# Patient Record
Sex: Female | Born: 1975 | Race: Black or African American | Hispanic: No | Marital: Single | Smoking: Never smoker
Health system: Southern US, Community
[De-identification: ages and names within clinical notes are randomized; demographics above are authoritative.]

## PROBLEM LIST (undated history)

## (undated) DIAGNOSIS — E785 Hyperlipidemia, unspecified: Secondary | ICD-10-CM

---

## 2011-08-02 HISTORY — PX: OTHER SURGICAL HISTORY: SHX169

## 2014-07-16 ENCOUNTER — Other Ambulatory Visit (HOSPITAL_COMMUNITY): Payer: Self-pay | Admitting: Physician Assistant

## 2014-07-16 ENCOUNTER — Other Ambulatory Visit: Payer: Self-pay | Admitting: Family Medicine

## 2014-07-16 DIAGNOSIS — Z1231 Encounter for screening mammogram for malignant neoplasm of breast: Secondary | ICD-10-CM

## 2014-08-04 ENCOUNTER — Ambulatory Visit (HOSPITAL_COMMUNITY)
Admission: RE | Admit: 2014-08-04 | Discharge: 2014-08-04 | Disposition: A | Discharge status: Home / Self Care | Payer: Self-pay | Source: Ambulatory Visit | Attending: Physician Assistant | Admitting: Physician Assistant

## 2014-08-04 DIAGNOSIS — Z1231 Encounter for screening mammogram for malignant neoplasm of breast: Secondary | ICD-10-CM

## 2016-01-15 ENCOUNTER — Ambulatory Visit (HOSPITAL_COMMUNITY)
Admission: EM | Admit: 2016-01-15 | Discharge: 2016-01-15 | Disposition: A | Discharge status: Home / Self Care | Payer: BLUE CROSS/BLUE SHIELD | Attending: Family Medicine | Admitting: Family Medicine

## 2016-01-15 ENCOUNTER — Encounter (HOSPITAL_COMMUNITY): Payer: Self-pay | Admitting: Nurse Practitioner

## 2016-01-15 DIAGNOSIS — T148 Other injury of unspecified body region: Secondary | ICD-10-CM

## 2016-01-15 DIAGNOSIS — W57XXXA Bitten or stung by nonvenomous insect and other nonvenomous arthropods, initial encounter: Secondary | ICD-10-CM | POA: Diagnosis not present

## 2016-01-15 MED ORDER — TRIAMCINOLONE ACETONIDE 40 MG/ML IJ SUSP
INTRAMUSCULAR | Status: AC
  Filled 2016-01-15: qty 1

## 2016-01-15 MED ORDER — METHYLPREDNISOLONE ACETATE 80 MG/ML IJ SUSP
INTRAMUSCULAR | Status: AC
  Filled 2016-01-15: qty 1

## 2016-01-15 MED ORDER — TRIAMCINOLONE ACETONIDE 40 MG/ML IJ SUSP
40.0000 mg | Freq: Once | INTRAMUSCULAR | Status: AC
  Administered 2016-01-15: 40 mg via INTRAMUSCULAR

## 2016-01-15 MED ORDER — FLUTICASONE PROPIONATE 0.05 % EX CREA: TOPICAL_CREAM | Freq: Two times a day (BID) | CUTANEOUS | Status: DC

## 2016-01-15 MED ORDER — METHYLPREDNISOLONE ACETATE 80 MG/ML IJ SUSP
80.0000 mg | Freq: Once | INTRAMUSCULAR | Status: AC
  Administered 2016-01-15: 80 mg via INTRAMUSCULAR

## 2016-01-15 NOTE — ED Provider Notes (Signed)
CSN: [id number]     Arrival date & time 01/15/16  1425 History   First MD Initiated Contact with Patient 01/15/16 1443     Chief Complaint  Patient presents with  . Rash   (Consider location/radiation/quality/duration/timing/severity/associated sxs/prior Treatment) Patient is a 40 y.o. female presenting with rash. The history is provided by the patient.  Rash Location:  Leg Leg rash location:  R lower leg, L lower leg, R upper leg and L upper leg Quality: itchiness and redness   Chronicity:  New Context: insect bite/sting   Context comment:  Onset after sand flea bites from cruise ship [place].   History reviewed. No pertinent past medical history. History reviewed. No pertinent past surgical history. History reviewed. No pertinent family history. Social History  Substance Use Topics  . Smoking status: Never Smoker   . Smokeless tobacco: None  . Alcohol Use: No   OB History    No data available     Review of Systems  Constitutional: Negative.   Skin: Positive for rash.  All other systems reviewed and are negative.   Allergies  Review of patient's allergies indicates no known allergies.  Home Medications   Prior to Admission medications   Medication Sig Start Date End Date Taking? Authorizing Provider  fluticasone (CUTIVATE) 0.05 % cream Apply topically 2 (two) times daily. 01/15/16   Linna HoffJames D Kindl, MD   Meds Ordered and Administered this Visit   Medications  methylPREDNISolone acetate (DEPO-MEDROL) injection 80 mg (not administered)  triamcinolone acetonide (KENALOG-40) injection 40 mg (not administered)    BP 138/80 mmHg  Pulse 77  Temp(Src) 98.6 F (37 C) (Oral)  Resp 18  SpO2 100% No data found.   Physical Exam  Constitutional: She is oriented to person, place, and time. She appears well-developed and well-nourished. No distress.  Neurological: She is alert and oriented to person, place, and time.  Skin: Skin is warm and dry. Rash noted.  There is erythema.  Mult excoriated papular lesions scattered on bilat lower legs.erythema but no infection.  Nursing note and vitals reviewed.   ED Course  Procedures (including critical care time)  Labs Review Labs Reviewed - No data to display  Imaging Review No results found.   Visual Acuity Review  Right Eye Distance:   Left Eye Distance:   Bilateral Distance:    Right Eye Near:   Left Eye Near:    Bilateral Near:         MDM   1. Multiple insect bites    Meds ordered this encounter  Medications  . methylPREDNISolone acetate (DEPO-MEDROL) injection 80 mg    Sig:   . triamcinolone acetonide (KENALOG-40) injection 40 mg    Sig:   . fluticasone (CUTIVATE) 0.05 % cream    Sig: Apply topically 2 (two) times daily.    Dispense:  30 g    Refill:  1       Linna HoffJames D Kindl, MD 01/15/16 [phone]

## 2016-01-15 NOTE — ED Notes (Signed)
She c/o 10 day history of insect bites to BLE. Onset after she was outside in [place] and [place]. She has tried hydrocortisone and benadryl with no relief.

## 2016-03-17 ENCOUNTER — Other Ambulatory Visit: Payer: Self-pay | Admitting: Physician Assistant

## 2016-03-17 DIAGNOSIS — Z1231 Encounter for screening mammogram for malignant neoplasm of breast: Secondary | ICD-10-CM

## 2016-03-23 ENCOUNTER — Ambulatory Visit
Admission: RE | Admit: 2016-03-23 | Discharge: 2016-03-23 | Disposition: A | Discharge status: Home / Self Care | Payer: BLUE CROSS/BLUE SHIELD | Source: Ambulatory Visit | Attending: Physician Assistant | Admitting: Physician Assistant

## 2016-03-23 DIAGNOSIS — Z1231 Encounter for screening mammogram for malignant neoplasm of breast: Secondary | ICD-10-CM

## 2016-10-23 ENCOUNTER — Encounter (HOSPITAL_COMMUNITY): Payer: Self-pay | Admitting: Emergency Medicine

## 2016-10-23 ENCOUNTER — Ambulatory Visit (HOSPITAL_COMMUNITY)
Admission: EM | Admit: 2016-10-23 | Discharge: 2016-10-23 | Disposition: A | Discharge status: Home / Self Care | Payer: BLUE CROSS/BLUE SHIELD | Attending: Family Medicine | Admitting: Family Medicine

## 2016-10-23 ENCOUNTER — Emergency Department (HOSPITAL_COMMUNITY)
Admission: EM | Admit: 2016-10-23 | Discharge: 2016-10-23 | Disposition: A | Discharge status: Home / Self Care | Payer: BLUE CROSS/BLUE SHIELD | Attending: Emergency Medicine | Admitting: Emergency Medicine

## 2016-10-23 DIAGNOSIS — R202 Paresthesia of skin: Secondary | ICD-10-CM | POA: Diagnosis not present

## 2016-10-23 DIAGNOSIS — M62838 Other muscle spasm: Secondary | ICD-10-CM | POA: Diagnosis not present

## 2016-10-23 DIAGNOSIS — M79601 Pain in right arm: Secondary | ICD-10-CM

## 2016-10-23 DIAGNOSIS — R200 Anesthesia of skin: Secondary | ICD-10-CM | POA: Diagnosis present

## 2016-10-23 MED ORDER — CYCLOBENZAPRINE HCL 10 MG PO TABS
10.0000 mg | ORAL_TABLET | Freq: Once | ORAL | Status: AC
  Administered 2016-10-23: 10 mg via ORAL
  Filled 2016-10-23: qty 1

## 2016-10-23 MED ORDER — CYCLOBENZAPRINE HCL 10 MG PO TABS: 10.0000 mg | ORAL_TABLET | Freq: Three times a day (TID) | ORAL | 0 refills | Status: DC | PRN

## 2016-10-23 MED ORDER — HYDROCODONE-ACETAMINOPHEN 5-325 MG PO TABS
1.0000 | ORAL_TABLET | Freq: Once | ORAL | Status: AC
  Administered 2016-10-23: 1 via ORAL
  Filled 2016-10-23: qty 1

## 2016-10-23 MED ORDER — HYDROCODONE-ACETAMINOPHEN 5-325 MG PO TABS: 1.0000 | ORAL_TABLET | Freq: Four times a day (QID) | ORAL | 0 refills | Status: AC | PRN

## 2016-10-23 MED ORDER — NAPROXEN 500 MG PO TABS: 500.0000 mg | ORAL_TABLET | Freq: Two times a day (BID) | ORAL | 0 refills | Status: AC | PRN

## 2016-10-23 NOTE — ED Triage Notes (Signed)
[facility] with a complaint of right arm numbness and tingling x 2 weeks that has recently caused a discoloration of the palm of her right hand.

## 2016-10-23 NOTE — ED Notes (Signed)
Pt is in stable condition upon d/c and ambulates from ED. 

## 2016-10-23 NOTE — ED Notes (Signed)
Right radial pulse 2 +.  Brisk capillary refill to all nailbeds.  Patient continues to reports arm feels different.  Patient has been rubbing both hands on jeans.  Able to remove blue coloring from right palm and fingers with alcohol swab

## 2016-10-23 NOTE — ED Provider Notes (Signed)
[facility]-EMERGENCY DEPT Provider Note   CSN: [id number] Arrival date & time: 10/23/16  1528     History   Chief Complaint Chief Complaint  Patient presents with  . Numbness    HPI Lori Foster is a 41 y.o. R-handed female with no significant PMHx, who presents to the ED sent from [facility] for evaluation of her R arm pain and R palm tingling. Per pt, 2 weeks ago she had gradual onset right shoulder and arm pain, initially she thought that she had slept on it wrong, but gradually over the course of the last 2 weeks the pain has become more intense and gradually moved into her right forearm. She has also developed some tingling in her right palm, diffusely throughout her palm with no specific or focal area; denies tingling on the dorsal aspect of her hand or elsewhere in her arm. She went to urgent care for this issue, however they sent her here for evaluation. Per chart review, [facility] notes reported that she was rubbing her hand on her jeans, and initially her palms looked blue, but alcohol wipes were used to clean her hands and some blue dye was removed from her palms.   She describes her pain as 7/10 constant soreness and throbbing in her right forearm, radiating down to her wrist and hand, worse with laying on her right arm, and on improved with ibuprofen and Aleve. She denies any repetitive movement activities with her wrist, denies playing tennis or golf, denies knitting or performing any other repetitive movements. She denies any injuries. She also denies any fevers, chills, neck pain, chest pain, shortness of breath, abdominal pain, n/v/d/c, dysuria, hematuria, numbness, focal weakness, incontinence of urine or stool, saddle anesthesia or cauda equina symptoms, bruising, swelling, rashes or itching, headache, vision changes, or any other complaints at this time.   The history is provided by the patient and medical records. No language interpreter was used.  Arm Injury   This is a new problem. The  current episode started more than 1 week ago. The problem occurs constantly. The problem has been gradually worsening. The pain is present in the right arm. Quality: throbbing soreness. The pain is at a severity of 7/10. The pain is moderate. Associated symptoms include tingling (R palm). Pertinent negatives include no numbness and full range of motion. Exacerbated by: lying on R arm. She has tried OTC pain medications for the symptoms. The treatment provided no relief. There has been no history of extremity trauma.    History reviewed. No pertinent past medical history.  There are no active problems to display for this patient.   Past Surgical History:  Procedure Laterality Date  . LEAP  2013    OB History    No data available       Home Medications    Prior to Admission medications   Medication Sig Start Date End Date Taking? Authorizing Provider  naproxen sodium (ANAPROX) 220 MG tablet Take 440 mg by mouth 2 (two) times daily with a meal.    Historical Provider, MD    Family History No family history on file.  Social History Social History  Substance Use Topics  . Smoking status: Never Smoker  . Smokeless tobacco: Never Used  . Alcohol use No     Allergies   Patient has no known allergies.   Review of Systems Review of Systems  Constitutional: Negative for chills and fever.  Eyes: Negative for visual disturbance.  Respiratory: Negative for shortness of breath.  Cardiovascular: Negative for chest pain.  Gastrointestinal: Negative for abdominal pain, constipation, diarrhea, nausea and vomiting.  Genitourinary: Negative for difficulty urinating (no incontinence), dysuria and hematuria.  Musculoskeletal: Positive for myalgias (R shoulder/forearm). Negative for arthralgias, back pain, joint swelling and neck pain.  Skin: Negative for color change and rash.  Allergic/Immunologic: Negative for immunocompromised state.  Neurological: Positive for tingling (R palm).  Negative for weakness, numbness and headaches.       +R palm tingling  Psychiatric/Behavioral: Negative for confusion.   10 Systems reviewed and are negative for acute change except as noted in the HPI.   Physical Exam Updated Vital Signs BP (!) 156/88   Pulse 75   Temp 97.5 F (36.4 C) (Oral)   Ht 5\' 6"  (1.676 m)   Wt 77.1 kg   LMP 09/07/2016   SpO2 100%   BMI 27.44 kg/m   Physical Exam  Constitutional: She is oriented to person, place, and time. Vital signs are normal. She appears well-developed and well-nourished.  Non-toxic appearance. No distress.  Afebrile, nontoxic, NAD  HENT:  Head: Normocephalic and atraumatic.  Mouth/Throat: Oropharynx is clear and moist and mucous membranes are normal.  Eyes: Conjunctivae and EOM are normal. Right eye exhibits no discharge. Left eye exhibits no discharge.  Neck: Normal range of motion. Neck supple. Muscular tenderness present. No spinous process tenderness present. No neck rigidity. Normal range of motion present.    FROM intact without spinous process TTP, no bony stepoffs or deformities, with mild R sided trapezius and paraspinous muscle TTP and palpable muscle spasms. No rigidity or meningeal signs. No bruising or swelling.   Cardiovascular: Normal rate, regular rhythm, normal heart sounds and intact distal pulses.  Exam reveals no gallop and no friction rub.   No murmur heard. Pulmonary/Chest: Effort normal and breath sounds normal. No respiratory distress. She has no decreased breath sounds. She has no wheezes. She has no rhonchi. She has no rales.  Abdominal: Soft. Normal appearance and bowel sounds are normal. She exhibits no distension. There is no tenderness. There is no rigidity, no rebound, no guarding, no CVA tenderness, no tenderness at McBurney's point and negative Murphy's sign.  Musculoskeletal: Normal range of motion.       Right shoulder: She exhibits tenderness (trapezius) and spasm. She exhibits normal range of  motion, no bony tenderness, no swelling, no effusion, no crepitus, no deformity, normal pulse and normal strength.       Right forearm: She exhibits tenderness. She exhibits no bony tenderness, no swelling, no edema and no deformity.       Right hand: She exhibits normal range of motion, no tenderness, no bony tenderness, normal capillary refill, no deformity and no swelling. Normal sensation noted. Normal strength noted.  C-spine as above, all other spinal levels nonTTP without bony stepoffs or deformities R shoulder with FROM intact, no bony TTP, with mild trapezius muscular TTP and spasms, and R forearm with diffuse extensor musculature tenderness and mild tightness/spasm felt, no focal bony or joint line TTP to wrist, hand, or elbow. No swelling/effusion to arm or any joints in RUE, no bruising or erythema, no abnormal warmth, no crepitus/deformity.  B/l palms slightly bluish tinted from jeans, however dorsal aspect of hands without abnormal discoloration. Hands and fingers warm to touch. No poikilothermia. B/l arm strength 5/5 in all areas (deltoids, triceps/biceps, wrist flexion/extension, hand opposition/abduction, etc).  Sensation grossly intact in all extremities, including to R hand. Distal pulses intact.  Cap refill brisk and present  in all digits. Compartments of all extremities are soft.  No extremity swelling or edema  Neurological: She is alert and oriented to person, place, and time. She has normal strength. No sensory deficit.  Skin: Skin is warm, dry and intact. No rash noted.  B/l palms slightly bluish due to rubbing hands on her dark blue jeans, which is removed with rubbing alcohol; remainder of skin appropriate for race. No paleness. Hands warm and dry.  Psychiatric: She has a normal mood and affect.  Nursing note and vitals reviewed.    ED Treatments / Results  Labs (all labs ordered are listed, but only abnormal results are displayed) Labs Reviewed - No data to  display  EKG  EKG Interpretation None       Radiology No results found.  Procedures Procedures (including critical care time)  Medications Ordered in ED Medications  cyclobenzaprine (FLEXERIL) tablet 10 mg (10 mg Oral Given 10/23/16 1639)  HYDROcodone-acetaminophen (NORCO/VICODIN) 5-325 MG per tablet 1 tablet (1 tablet Oral Given 10/23/16 1639)     Initial Impression / Assessment and Plan / ED Course  I have reviewed the triage vital signs and the nursing notes.  Pertinent labs & imaging results that were available during my care of the patient were reviewed by me and considered in my medical decision making (see chart for details).     41 y.o. female here from [facility] for further eval of her R arm pain and paresthesias x2 wks. They were initially concerned that her hands were blue, but later found that it was due to rubbing her jeans and the dye rubbed off onto her palms; able to mostly get it off with alcohol. On exam, no focal spinal tenderness, tenderness to R trapezius region with +spasm, and diffusely in the R forearm musculature, no focal bony or joint line TTP, sensation intact, distal pulses intact, cap refill brisk and present in all digits, hands and fingers feel warm and perfused, strength intact and symmetric. Overall it seems that she likely has perhaps a pinched nerve either related to the muscle spasm in her trapezius or perhaps from a cervical etiology, however denies neck pain and no focal tenderness in C-spine; no s/sx of cord compression; doubt need for labs or imaging. Doubt neurologic etiology. Doubt DVT, acute arterial occlusion, or other vascular etiology. Will give flexeril and norco, will hold naprosyn since she just took it PTA; will reassess shortly.   6:23 PM Pt feeling better, symptoms improving. Likely muscle spasm causing slight nerve impingement. Of note, washed hands and her hands are no longer bluish tinted. Will send home with rx for naprosyn, norco, and  flexeril. [place] prior to dispensing controlled substance medications, and was notable for: no controlled substances found on 1 year search. Risks/benefits/alternatives and expectations discussed regarding controlled substances. Side effects of medications discussed. Informed consent obtained.  Advised ice/heat use. F/up with PCP in 1wk for recheck of symptoms. I explained the diagnosis and have given explicit precautions to return to the ER including for any other new or worsening symptoms. The patient understands and accepts the medical plan as it's been dictated and I have answered their questions. Discharge instructions concerning home care and prescriptions have been given. The patient is STABLE and is discharged to home in good condition.   Final Clinical Impressions(s) / ED Diagnoses   Final diagnoses:  Trapezius muscle spasm  Right arm pain  Paresthesias    New Prescriptions New Prescriptions   CYCLOBENZAPRINE (FLEXERIL) 10  MG TABLET    Take 1 tablet (10 mg total) by mouth 3 (three) times daily as needed for muscle spasms.   HYDROCODONE-ACETAMINOPHEN (NORCO) 5-325 MG TABLET    Take 1 tablet by mouth every 6 (six) hours as needed for severe pain.   NAPROXEN (NAPROSYN) 500 MG TABLET    Take 1 tablet (500 mg total) by mouth 2 (two) times daily as needed for mild pain, moderate pain or headache (TAKE WITH MEALS.).     [address] Big Rock [address]ove AvenueMercedes Lexington Devine, PA-C 10/23/16 1824    Lorre NickAnthony Allen, MD 10/25/16 1210

## 2016-10-23 NOTE — ED Triage Notes (Addendum)
Pt sent from urgent care for further eval of numbness and tingling to right arm x 2 weeks. Pt denies injury. Pt has discoloration to B/L hands R>L. B/L hands cool to touch R>L

## 2016-10-23 NOTE — ED Provider Notes (Signed)
CSN: [id number]     Arrival date & time 10/23/16  1351 History   First MD Initiated Contact with Patient 10/23/16 1456     Chief Complaint  Patient presents with  . Tingling   (Consider location/radiation/quality/duration/timing/severity/associated sxs/prior Treatment)  HPI   She is a 41 year old female presenting today with complaints of right shoulder and arm pain for approximately 2 weeks. Patient states the pain is severe in nature and has progressed to tingling and numbness where she has lost sensation to her right hand. Patient denies any known injury, blood clots or bleeding disorder and has never been a smoker. She is not taking oral birth control  History reviewed. No pertinent past medical history. Past Surgical History:  Procedure Laterality Date  . LEAP  2013   History reviewed. No pertinent family history. Social History  Substance Use Topics  . Smoking status: Never Smoker  . Smokeless tobacco: Never Used  . Alcohol use No   OB History    No data available     Review of Systems  Constitutional: Negative.  Negative for fatigue and fever.  HENT: Negative.   Eyes: Negative.  Negative for visual disturbance.  Respiratory: Negative.  Negative for cough and shortness of breath.   Cardiovascular: Negative.  Negative for chest pain and leg swelling.  Gastrointestinal: Negative.   Endocrine: Negative.   Genitourinary: Negative.   Musculoskeletal: Negative.  Negative for gait problem and neck stiffness.  Skin: Positive for color change.  Allergic/Immunologic: Negative.   Neurological: Positive for numbness. Negative for dizziness and headaches.  Hematological: Negative.   Psychiatric/Behavioral: Negative.     Allergies  Patient has no known allergies.  Home Medications   Prior to Admission medications   Medication Sig Start Date End Date Taking? Authorizing Provider  naproxen sodium (ANAPROX) 220 MG tablet Take 440 mg by mouth 2 (two) times daily with a meal.    Yes Historical Provider, MD   Meds Ordered and Administered this Visit  Medications - No data to display  BP (!) 145/101 (BP Location: Left Arm)   Pulse 81   Temp 98.5 F (36.9 C) (Oral)   Resp 18   SpO2 100%  No data found.  Manual B/P checked on L arm.  154/82.  Physical Exam  Constitutional: She is oriented to person, place, and time. She appears well-developed and well-nourished. No distress.  Cardiovascular: Normal rate, regular rhythm, normal heart sounds and intact distal pulses.  Exam reveals no gallop and no friction rub.   No murmur heard. Pulmonary/Chest: Effort normal and breath sounds normal. No respiratory distress. She has no wheezes. She has no rales. She exhibits no tenderness.  Musculoskeletal: Normal range of motion. She exhibits tenderness. She exhibits no edema.  R lower extremity is painful to patient.   Neurological: She is alert and oriented to person, place, and time. She exhibits normal muscle tone. Coordination normal.  Skin: She is not diaphoretic.  Patient has 2+ bilateral radial pulses, but right hand is cooler than left and discolored compared to left.  Initially R hand appeared significantly blue.  Patient was rubbing R hand on her jeans from pain and we were able to remove blue dye from R hand with alcohol preps.    Nursing note and vitals reviewed.   Urgent Care Course     Procedures (including critical care time)  Labs Review Labs Reviewed - No data to display  Imaging Review No results found.  Plan of care was discussed with  Dr. Milus Glazier and patient sent to [facility] ED for further workup and evaluation.   MDM   1. Right arm pain    The usual and customary discharge instructions and warnings were given.  The patient verbalizes understanding and agrees to plan of care.       Servando Salina, NP 10/23/16 1528

## 2016-10-23 NOTE — Discharge Instructions (Signed)
Take naprosyn as directed for inflammation and pain with norco for breakthrough pain and flexeril for muscle relaxation. Do not drive or operate machinery with pain medication or muscle relaxant use. Use heat or ice to areas of soreness, using ice/heat pack for no more than 20 minutes at a time every hour for each. Follow up with primary care physician for recheck of ongoing symptoms in the next 1-2 weeks. Return to ER for emergent changing or worsening of symptoms.

## 2016-11-04 ENCOUNTER — Encounter: Payer: Self-pay | Admitting: Neurology

## 2016-11-07 ENCOUNTER — Other Ambulatory Visit: Payer: Self-pay | Admitting: *Deleted

## 2016-11-07 DIAGNOSIS — M25511 Pain in right shoulder: Secondary | ICD-10-CM

## 2016-11-22 ENCOUNTER — Ambulatory Visit (INDEPENDENT_AMBULATORY_CARE_PROVIDER_SITE_OTHER): Payer: BLUE CROSS/BLUE SHIELD | Admitting: Neurology

## 2016-11-22 DIAGNOSIS — M25511 Pain in right shoulder: Secondary | ICD-10-CM | POA: Diagnosis not present

## 2016-11-22 NOTE — Procedures (Signed)
[facility]  [address] [place], [address]  [place], [place] [address] Tel: [phone] Fax:  [phone] Test Date:  11/22/2016  Patient: Lori Foster DOB: 02/06/1976 Physician: Nita Sickle, DO  Sex: Female Height:  Ref Phys: Gardenia Phlegm  ID#: [id number] Temp: 32.5C Technician:    Patient Complaints: This is a 41 year-old female referred for evaluation of one-month history of right shoulder pain radiating into her forearm and hand.  NCV & EMG Findings: Extensive electrodiagnostic testing of the right upper extremitiy shows: 1. All sensory responses are within normal limits include right median, ulnar, radial, mixed palmer, medial and lateral antebrachial nerves. 2. Right median and ulnar motor responses are within normal limits. 3. There is no evidence of nerve or muscle injury affecting any of the tested muscles.  Motor unit configuration and recruitment pattern is within normal limits.  Impression: This is a normal study of the right upper extremity.   In particular, there is no evidence of a cervical radiculopathy, brachial plexopathy, or carpal tunnel syndrome.   ___________________________ Nita Sickle, DO    Nerve Conduction Studies Anti Sensory Summary Table   Site NR Peak (ms) Norm Peak (ms) P-T Amp (V) Norm P-T Amp  Right Lat Ante Brach Cutan Anti Sensory (Lat Forearm)  32.5C  Lat Biceps    2.4 <2.9 12.0   Right Med Ante Brach Cutan Anti Sensory (Med Forearm)  32.5C  Elbow    2.4  11.8   Right Median Anti Sensory (2nd Digit)  32.5C  Wrist    3.2 <3.4 79.6 >20  Right Radial Anti Sensory (Base 1st Digit)  32.5C  Wrist    2.2 <2.7 34.4 >18  Right Ulnar Anti Sensory (5th Digit)  32.5C  Wrist    2.8 <3.1 65.2 >12   Motor Summary Table   Site NR Onset (ms) Norm Onset (ms) O-P Amp (mV) Norm O-P Amp Site1 Site2 Delta-0 (ms) Dist (cm) Vel (m/s) Norm Vel (m/s)  Right Median Motor (Abd Poll Brev)  32.5C  Wrist    2.5 <3.9 11.4 >6 Elbow  Wrist 4.3 27.0 63 >50  Elbow    6.8  11.3         Right Ulnar Motor (Abd Dig Minimi)  32.5C  Wrist    2.3 <3.1 10.7 >7 B Elbow Wrist 3.7 24.5 66 >50  B Elbow    6.0  10.0  A Elbow B Elbow 1.7 10.0 59 >50  A Elbow    7.7  9.7          Comparison Summary Table   Site NR Peak (ms) Norm Peak (ms) P-T Amp (V) Site1 Site2 Delta-P (ms) Norm Delta (ms)  Right Median/Ulnar Palm Comparison (Wrist - 8cm)  32.5C  Median Palm    1.8 <2.2 63.7 Median Palm Ulnar Palm 0.2   Ulnar Palm    1.6 <2.2 23.4       EMG   Side Muscle Ins Act Fibs Psw Fasc Number Recrt Dur Dur. Amp Amp. Poly Poly. Comment  Right 1stDorInt Nml Nml Nml Nml Nml Nml Nml Nml Nml Nml Nml Nml N/A  Right Ext Indicis Nml Nml Nml Nml Nml Nml Nml Nml Nml Nml Nml Nml N/A  Right PronatorTeres Nml Nml Nml Nml Nml Nml Nml Nml Nml Nml Nml Nml N/A  Right Biceps Nml Nml Nml Nml Nml Nml Nml Nml Nml Nml Nml Nml N/A  Right Triceps Nml Nml Nml Nml Nml Nml Nml Nml Nml Nml  Nml Nml N/A  Right Deltoid Nml Nml Nml Nml Nml Nml Nml Nml Nml Nml Nml Nml N/A  Right Infraspinatus Nml Nml Nml Nml Nml Nml Nml Nml Nml Nml Nml Nml N/A      Waveforms:

## 2017-05-09 ENCOUNTER — Other Ambulatory Visit: Payer: Self-pay | Admitting: Physician Assistant

## 2017-05-09 DIAGNOSIS — Z1231 Encounter for screening mammogram for malignant neoplasm of breast: Secondary | ICD-10-CM

## 2017-05-31 ENCOUNTER — Ambulatory Visit
Admission: RE | Admit: 2017-05-31 | Discharge: 2017-05-31 | Disposition: A | Discharge status: Home / Self Care | Payer: BLUE CROSS/BLUE SHIELD | Source: Ambulatory Visit | Attending: Physician Assistant | Admitting: Physician Assistant

## 2017-05-31 DIAGNOSIS — Z1231 Encounter for screening mammogram for malignant neoplasm of breast: Secondary | ICD-10-CM

## 2018-05-09 ENCOUNTER — Other Ambulatory Visit: Payer: Self-pay | Admitting: Physician Assistant

## 2018-05-09 DIAGNOSIS — Z1231 Encounter for screening mammogram for malignant neoplasm of breast: Secondary | ICD-10-CM

## 2018-06-11 ENCOUNTER — Ambulatory Visit
Admission: RE | Admit: 2018-06-11 | Discharge: 2018-06-11 | Disposition: A | Discharge status: Home / Self Care | Payer: BLUE CROSS/BLUE SHIELD | Source: Ambulatory Visit | Attending: Physician Assistant | Admitting: Physician Assistant

## 2018-06-11 DIAGNOSIS — Z1231 Encounter for screening mammogram for malignant neoplasm of breast: Secondary | ICD-10-CM

## 2018-06-13 ENCOUNTER — Other Ambulatory Visit: Payer: Self-pay | Admitting: Physician Assistant

## 2018-06-13 DIAGNOSIS — R928 Other abnormal and inconclusive findings on diagnostic imaging of breast: Secondary | ICD-10-CM

## 2018-06-19 ENCOUNTER — Ambulatory Visit
Admission: RE | Admit: 2018-06-19 | Discharge: 2018-06-19 | Disposition: A | Discharge status: Home / Self Care | Payer: BLUE CROSS/BLUE SHIELD | Source: Ambulatory Visit | Attending: Physician Assistant | Admitting: Physician Assistant

## 2018-06-19 DIAGNOSIS — R928 Other abnormal and inconclusive findings on diagnostic imaging of breast: Secondary | ICD-10-CM

## 2019-05-22 ENCOUNTER — Other Ambulatory Visit: Payer: Self-pay | Admitting: Physician Assistant

## 2019-05-22 DIAGNOSIS — Z1231 Encounter for screening mammogram for malignant neoplasm of breast: Secondary | ICD-10-CM

## 2019-07-09 ENCOUNTER — Other Ambulatory Visit: Payer: Self-pay

## 2019-07-09 ENCOUNTER — Ambulatory Visit
Admission: RE | Admit: 2019-07-09 | Discharge: 2019-07-09 | Disposition: A | Discharge status: Home / Self Care | Payer: BC Managed Care – PPO | Source: Ambulatory Visit | Attending: Physician Assistant | Admitting: Physician Assistant

## 2019-07-09 DIAGNOSIS — Z1231 Encounter for screening mammogram for malignant neoplasm of breast: Secondary | ICD-10-CM

## 2020-03-17 ENCOUNTER — Other Ambulatory Visit (HOSPITAL_COMMUNITY)
Admission: RE | Admit: 2020-03-17 | Discharge: 2020-03-17 | Disposition: A | Discharge status: Home / Self Care | Payer: BC Managed Care – PPO | Source: Ambulatory Visit | Attending: Physician Assistant | Admitting: Physician Assistant

## 2020-03-17 ENCOUNTER — Other Ambulatory Visit: Payer: Self-pay | Admitting: Physician Assistant

## 2020-03-17 DIAGNOSIS — Z124 Encounter for screening for malignant neoplasm of cervix: Secondary | ICD-10-CM | POA: Insufficient documentation

## 2020-03-20 LAB — CYTOLOGY - PAP
Adequacy: ABSENT
Diagnosis: NEGATIVE

## 2020-09-24 ENCOUNTER — Other Ambulatory Visit: Payer: Self-pay | Admitting: Physician Assistant

## 2020-09-24 DIAGNOSIS — Z1231 Encounter for screening mammogram for malignant neoplasm of breast: Secondary | ICD-10-CM

## 2020-11-13 ENCOUNTER — Inpatient Hospital Stay: Admission: RE | Admit: 2020-11-13 | Payer: BC Managed Care – PPO | Source: Ambulatory Visit

## 2020-11-16 ENCOUNTER — Other Ambulatory Visit: Payer: Self-pay

## 2020-11-16 ENCOUNTER — Ambulatory Visit
Admission: RE | Admit: 2020-11-16 | Discharge: 2020-11-16 | Disposition: A | Discharge status: Home / Self Care | Payer: BC Managed Care – PPO | Source: Ambulatory Visit | Attending: Physician Assistant | Admitting: Physician Assistant

## 2020-11-16 DIAGNOSIS — Z1231 Encounter for screening mammogram for malignant neoplasm of breast: Secondary | ICD-10-CM

## 2020-11-17 ENCOUNTER — Other Ambulatory Visit: Payer: Self-pay | Admitting: Physician Assistant

## 2020-11-17 DIAGNOSIS — R928 Other abnormal and inconclusive findings on diagnostic imaging of breast: Secondary | ICD-10-CM

## 2020-12-08 ENCOUNTER — Ambulatory Visit
Admission: RE | Admit: 2020-12-08 | Discharge: 2020-12-08 | Disposition: A | Discharge status: Home / Self Care | Payer: BC Managed Care – PPO | Source: Ambulatory Visit | Attending: Physician Assistant | Admitting: Physician Assistant

## 2020-12-08 ENCOUNTER — Other Ambulatory Visit: Payer: Self-pay | Admitting: Physician Assistant

## 2020-12-08 ENCOUNTER — Other Ambulatory Visit: Payer: Self-pay

## 2020-12-08 DIAGNOSIS — R928 Other abnormal and inconclusive findings on diagnostic imaging of breast: Secondary | ICD-10-CM

## 2021-06-11 ENCOUNTER — Ambulatory Visit
Admission: RE | Admit: 2021-06-11 | Discharge: 2021-06-11 | Disposition: A | Discharge status: Home / Self Care | Payer: BC Managed Care – PPO | Source: Ambulatory Visit | Attending: Physician Assistant | Admitting: Physician Assistant

## 2021-06-11 ENCOUNTER — Other Ambulatory Visit: Payer: Self-pay | Admitting: Physician Assistant

## 2021-06-11 ENCOUNTER — Other Ambulatory Visit: Payer: Self-pay

## 2021-06-11 DIAGNOSIS — N6320 Unspecified lump in the left breast, unspecified quadrant: Secondary | ICD-10-CM

## 2021-06-11 DIAGNOSIS — R928 Other abnormal and inconclusive findings on diagnostic imaging of breast: Secondary | ICD-10-CM

## 2021-10-17 ENCOUNTER — Emergency Department (HOSPITAL_BASED_OUTPATIENT_CLINIC_OR_DEPARTMENT_OTHER)
Admission: EM | Admit: 2021-10-17 | Discharge: 2021-10-17 | Disposition: A | Discharge status: Home / Self Care | Payer: BC Managed Care – PPO | Attending: Emergency Medicine | Admitting: Emergency Medicine

## 2021-10-17 ENCOUNTER — Emergency Department (HOSPITAL_BASED_OUTPATIENT_CLINIC_OR_DEPARTMENT_OTHER): Payer: BC Managed Care – PPO | Admitting: Radiology

## 2021-10-17 ENCOUNTER — Other Ambulatory Visit: Payer: Self-pay

## 2021-10-17 ENCOUNTER — Encounter (HOSPITAL_BASED_OUTPATIENT_CLINIC_OR_DEPARTMENT_OTHER): Payer: Self-pay | Admitting: Emergency Medicine

## 2021-10-17 DIAGNOSIS — R0789 Other chest pain: Secondary | ICD-10-CM | POA: Insufficient documentation

## 2021-10-17 DIAGNOSIS — R079 Chest pain, unspecified: Secondary | ICD-10-CM | POA: Diagnosis not present

## 2021-10-17 DIAGNOSIS — R0602 Shortness of breath: Secondary | ICD-10-CM | POA: Diagnosis not present

## 2021-10-17 HISTORY — DX: Hyperlipidemia, unspecified: E78.5

## 2021-10-17 LAB — BASIC METABOLIC PANEL
Anion gap: 13 (ref 5–15)
BUN: 16 mg/dL (ref 6–20)
CO2: 21 mmol/L — ABNORMAL LOW (ref 22–32)
Calcium: 9.8 mg/dL (ref 8.9–10.3)
Chloride: 103 mmol/L (ref 98–111)
Creatinine, Ser: 0.83 mg/dL (ref 0.44–1.00)
GFR, Estimated: >60 mL/min (ref >60)
Glucose, Bld: 98 mg/dL (ref 70–99)
Potassium: 3.8 mmol/L (ref 3.5–5.1)
Sodium: 137 mmol/L (ref 135–145)

## 2021-10-17 LAB — CBC
HCT: 40.2 % (ref 36.0–46.0)
Hemoglobin: 13.2 g/dL (ref 12.0–15.0)
MCH: 29.4 pg (ref 26.0–34.0)
MCHC: 32.8 g/dL (ref 30.0–36.0)
MCV: 89.5 fL (ref 80.0–100.0)
Platelets: 191 10*3/uL (ref 150–400)
RBC: 4.49 MIL/uL (ref 3.87–5.11)
RDW: 13.7 % (ref 11.5–15.5)
WBC: 9.6 10*3/uL (ref 4.0–10.5)
nRBC: 0 % (ref 0.0–0.2)

## 2021-10-17 LAB — TROPONIN I (HIGH SENSITIVITY)
Troponin I (High Sensitivity): <2 ng/L (ref <18)
Troponin I (High Sensitivity): <2 ng/L (ref <18)

## 2021-10-17 LAB — D-DIMER, QUANTITATIVE: D-Dimer, Quant: 0.47 ug/mL-FEU (ref 0.00–0.50)

## 2021-10-17 MED ORDER — MORPHINE SULFATE (PF) 4 MG/ML IV SOLN
4.0000 mg | Freq: Once | INTRAVENOUS | Status: AC
  Administered 2021-10-17: 4 mg via INTRAVENOUS
  Filled 2021-10-17: qty 1

## 2021-10-17 MED ORDER — DIPHENHYDRAMINE HCL 50 MG/ML IJ SOLN
50.0000 mg | Freq: Once | INTRAMUSCULAR | Status: AC
  Administered 2021-10-17: 50 mg via INTRAVENOUS
  Filled 2021-10-17: qty 1

## 2021-10-17 MED ORDER — NAPROXEN SODIUM 220 MG PO TABS: 440.0000 mg | ORAL_TABLET | Freq: Two times a day (BID) | ORAL | 0 refills | Status: AC | PRN

## 2021-10-17 MED ORDER — CYCLOBENZAPRINE HCL 10 MG PO TABS: 10.0000 mg | ORAL_TABLET | Freq: Three times a day (TID) | ORAL | 0 refills | Status: AC | PRN

## 2021-10-17 NOTE — ED Provider Notes (Signed)
?[facility]PT ?Provider Note ? ? ?CSN: [id number] ?Arrival date & time: 10/17/21  1409 ? ?  ? ?History ? ?Chief Complaint  ?Patient presents with  ? Chest Pain  ? ? ?Lori Foster is a 46 y.o. female. ? ?The history is provided by the patient and the spouse. No language interpreter was used.  ?Chest Pain ? ?46 year old female significant history of hyperlipidemia presenting complaining of chest pain patient reported cute onset of pain to the left side of her chest radiates to her back that started approximately an hour ago.  States she was just leaning down to pick up a pair of shoes when this pain came about.  Pain initially very intense and has improved a slight bit however worsening pain when she tries to take a deep breath.  No associated fever or chills no lightheadedness, dizziness, diaphoresis, nausea, vomiting, diarrhea, constipation, or abdominal pain.  No prior history of PE or DVT no recent surgery prolonged bedrest active cancer leg swelling calf pain or taking oral hormone.  Denies alcohol or tobacco use.  Does not take any medication on a regular basis. ? ?Home Medications ?Prior to Admission medications   ?Medication Sig Start Date End Date Taking? Authorizing Provider  ?cyclobenzaprine (FLEXERIL) 10 MG tablet Take 1 tablet (10 mg total) by mouth 3 (three) times daily as needed for muscle spasms. 10/23/16   Street, [place], PA-C  ?HYDROcodone-acetaminophen (NORCO) 5-325 MG tablet Take 1 tablet by mouth every 6 (six) hours as needed for severe pain. 10/23/16   Street, [place], PA-C  ?ibuprofen (ADVIL,MOTRIN) 200 MG tablet Take 400 mg by mouth every 6 (six) hours as needed for headache (pain).    [provider]  ?naproxen (NAPROSYN) 500 MG tablet Take 1 tablet (500 mg total) by mouth 2 (two) times daily as needed for mild pain, moderate pain or headache (TAKE WITH MEALS.). 10/23/16   Street, [place], PA-C  ?naproxen sodium (ALEVE) 220 MG tablet Take 440 mg by mouth  once.    [provider]  ?   ? ?Allergies    ?Shrimp [shellfish allergy]   ? ?Review of Systems   ?Review of Systems  ?Cardiovascular:  Positive for chest pain.  ?All other systems reviewed and are negative. ? ?Physical Exam ?Updated Vital Signs ?BP (!) 157/116 (BP Location: Left Arm)   Pulse 95   Temp 98.2 ?F (36.8 ?C) (Oral)   Resp 13   Ht 5\' 6"  (1.676 m)   Wt 77.1 kg   LMP 09/26/2021   SpO2 100%   BMI 27.44 kg/m?  ?Physical Exam ?Vitals and nursing note reviewed.  ?Constitutional:   ?   Appearance: She is well-developed.  ?   Comments: Patient appears uncomfortable, standing upright.  ?HENT:  ?   Head: Atraumatic.  ?Eyes:  ?   Conjunctiva/sclera: Conjunctivae normal.  ?Cardiovascular:  ?   Rate and Rhythm: Normal rate and regular rhythm.  ?   Pulses: Normal pulses.  ?   Heart sounds: Normal heart sounds. No murmur heard. ?  No friction rub. No gallop.  ?Pulmonary:  ?   Effort: Pulmonary effort is normal.  ?Chest:  ?   Chest wall: Tenderness (Tenderness to left chest and left upper back on palpation without any overlying skin changes or rash.) present.  ?Abdominal:  ?   Palpations: Abdomen is soft.  ?   Tenderness: There is no abdominal tenderness.  ?Musculoskeletal:     ?   General: No swelling.  ?  Cervical back: Neck supple.  ?   Right lower leg: No edema.  ?   Left lower leg: No edema.  ?Skin: ?   Findings: No rash.  ?Neurological:  ?   Mental Status: She is alert. Mental status is at baseline.  ?Psychiatric:     ?   Mood and Affect: Mood normal.  ? ? ?ED Results / Procedures / Treatments   ?Labs ?(all labs ordered are listed, but only abnormal results are displayed) ?Labs Reviewed  ?BASIC METABOLIC PANEL - Abnormal; Notable for the following components:  ?    Result Value  ? CO2 21 (*)   ? All other components within normal limits  ?CBC  ?D-DIMER, QUANTITATIVE  ?PREGNANCY, URINE  ?TROPONIN I (HIGH SENSITIVITY)  ?TROPONIN I (HIGH SENSITIVITY)  ? ? ?EKG ?None ? Date: 10/17/2021 ? Rate: 88 ?  Rhythm: normal sinus rhythm ? QRS Axis: normal ? Intervals: normal ? ST/T Wave abnormalities: normal ? Conduction Disutrbances: none ? Narrative Interpretation:  ? Old EKG Reviewed: No significant changes noted ? ? ? ?Radiology ?DG Chest 2 View ? ?Result Date: 10/17/2021 ?CLINICAL DATA:  Chest pain and shortness of breath. EXAM: CHEST - 2 VIEW COMPARISON:  None. FINDINGS: The cardiomediastinal contours are normal. The lungs are clear. Pulmonary vasculature is normal. No consolidation, pleural effusion, or pneumothorax. No acute osseous abnormalities are seen. IMPRESSION: Negative radiographs of the chest. Electronically Signed   By: Narda RutherfordMelanie  Sanford M.D.   On: 10/17/2021 15:27   ? ?Procedures ?Procedures  ? ? ?Medications Ordered in ED ?Medications  ?morphine (PF) 4 MG/ML injection 4 mg (4 mg Intravenous Given 10/17/21 1456)  ?diphenhydrAMINE (BENADRYL) injection 50 mg (50 mg Intravenous Given 10/17/21 1503)  ? ? ?ED Course/ Medical Decision Making/ A&P ?  ?                        ?Medical Decision Making ?Amount and/or Complexity of Data Reviewed ?Labs: ordered. ?Radiology: ordered. ? ?Risk ?Prescription drug management. ? ? ?BP (!) 157/116 (BP Location: Left Arm)   Pulse 95   Temp 98.2 ?F (36.8 ?C) (Oral)   Resp 13   Ht 5\' 6"  (1.676 m)   Wt 77.1 kg   LMP 09/26/2021   SpO2 100%   BMI 27.44 kg/m?  ? ?2:54 PM ?This is a 46 year old female significant history of HLD who is presenting with acute onset of left-sided chest pain.  Pain came on very suddenly, intense, and worse when she takes a deep breath.  She appears uncomfortable on initial exam.  Heart and lung sounds normal, breath sounds present bilaterally.  Given her presentation, will provide opiate pain medication for pain control, screening chest x-ray order, D-dimer ordered as well and may consider advanced imaging such as dissection study. ? ?3:52 PM ?Labs, EKG, and imaging independently reviewed and interpreted by me.  EKG without concerning arrhythmia  or ischemic changes.  Electrolyte panels are reassuring, normal WBC, normal H&H, initial troponin negative, D-dimer is normal, x-ray of the chest without any signs of PTX or any acute abnormalities.  After receiving medication for pain on reassessment, patient reported feeling better.  She appears more comfortable.  At this time I have low suspicion for PE or dissection causing her symptoms.  Suspect could be costochondritis or muscle strain causing her discomfort.  Will obtain delta troponin and will continue to monitor.  Care discussed with dr. Wallace CullensGray ? ?6:13 PM ?Negative delta troponin.  Reassurance given.  Patient otherwise stable for discharge. ? ? ?This patient presents to the ED for concern of chest pain, this involves an extensive number of treatment options, and is a complaint that carries with it a high risk of complications and morbidity.  The differential diagnosis includes PTX, dissection, PE, costochondritis, ACS, perforated ulcer ? ?Co morbidities that complicate the patient evaluation ?HLD ?Additional history obtained: ? ?Additional history obtained from husband ?External records from outside source obtained and reviewed including notes from her PCP ? ?Lab Tests: ? ?I Ordered, and personally interpreted labs.  The pertinent results include:  as discussed above ? ?Imaging Studies ordered: ? ?I ordered imaging studies including CXR ?I independently visualized and interpreted imaging which showed unremarkable.  ?I agree with the radiologist interpretation ? ?Cardiac Monitoring: ? ?The patient was maintained on a cardiac monitor.  I personally viewed and interpreted the cardiac monitored which showed an underlying rhythm of: NSR ? ?Medicines ordered and prescription drug management: ? ?I ordered medication including morphine  for pain ?Reevaluation of the patient after these medicines showed that the patient improved ?I have reviewed the patients home medicines and have made adjustments as needed ? ?Test  Considered: ?chest CTA, but pt has negative d-dimer therefore low risk of PE or disseection ? ?Critical Interventions: ?benadryl due to itchiness after morning ? ? ?Problem List / ED Course: ?chest pain ? ?Reevaluation:

## 2021-10-17 NOTE — ED Triage Notes (Signed)
Patient states she was bending down to pick something up when a sudden sharp pain to left side of her chest started and having difficulty breathing. Pain radiates into back. Pt very anxious in triage.  ?

## 2021-10-17 NOTE — Discharge Instructions (Addendum)
You have been evaluated for your symptoms.  Fortunately your labs are reassuring.  No evidence of collapsed lung or signs to suggest blood clot in your lungs or other life-threatening emergency.  No signs of heart attack.  Your pain may be due to inflammations of your muscle between the ribs causing discomfort.  Take medication prescribed and follow-up with your doctor for further care.  Return if your symptoms worsen ?

## 2021-10-22 DIAGNOSIS — E7800 Pure hypercholesterolemia, unspecified: Secondary | ICD-10-CM | POA: Diagnosis not present

## 2021-10-22 DIAGNOSIS — R079 Chest pain, unspecified: Secondary | ICD-10-CM | POA: Diagnosis not present

## 2021-12-13 ENCOUNTER — Ambulatory Visit
Admission: RE | Admit: 2021-12-13 | Discharge: 2021-12-13 | Disposition: A | Discharge status: Home / Self Care | Payer: BC Managed Care – PPO | Source: Ambulatory Visit | Attending: Physician Assistant | Admitting: Physician Assistant

## 2021-12-13 DIAGNOSIS — N6320 Unspecified lump in the left breast, unspecified quadrant: Secondary | ICD-10-CM

## 2021-12-13 DIAGNOSIS — N6012 Diffuse cystic mastopathy of left breast: Secondary | ICD-10-CM | POA: Diagnosis not present

## 2021-12-13 DIAGNOSIS — R922 Inconclusive mammogram: Secondary | ICD-10-CM | POA: Diagnosis not present

## 2022-05-25 ENCOUNTER — Other Ambulatory Visit: Payer: Self-pay | Admitting: Physician Assistant

## 2022-05-25 DIAGNOSIS — N644 Mastodynia: Secondary | ICD-10-CM

## 2022-05-25 DIAGNOSIS — N6002 Solitary cyst of left breast: Secondary | ICD-10-CM | POA: Diagnosis not present

## 2022-06-08 ENCOUNTER — Other Ambulatory Visit: Payer: BC Managed Care – PPO

## 2022-06-20 DIAGNOSIS — M9903 Segmental and somatic dysfunction of lumbar region: Secondary | ICD-10-CM | POA: Diagnosis not present

## 2022-06-20 DIAGNOSIS — M9901 Segmental and somatic dysfunction of cervical region: Secondary | ICD-10-CM | POA: Diagnosis not present

## 2022-06-20 DIAGNOSIS — M9902 Segmental and somatic dysfunction of thoracic region: Secondary | ICD-10-CM | POA: Diagnosis not present

## 2022-06-20 DIAGNOSIS — M9904 Segmental and somatic dysfunction of sacral region: Secondary | ICD-10-CM | POA: Diagnosis not present

## 2022-06-22 DIAGNOSIS — M9901 Segmental and somatic dysfunction of cervical region: Secondary | ICD-10-CM | POA: Diagnosis not present

## 2022-06-22 DIAGNOSIS — M9903 Segmental and somatic dysfunction of lumbar region: Secondary | ICD-10-CM | POA: Diagnosis not present

## 2022-06-22 DIAGNOSIS — M9904 Segmental and somatic dysfunction of sacral region: Secondary | ICD-10-CM | POA: Diagnosis not present

## 2022-06-22 DIAGNOSIS — M9902 Segmental and somatic dysfunction of thoracic region: Secondary | ICD-10-CM | POA: Diagnosis not present

## 2022-06-28 DIAGNOSIS — M9901 Segmental and somatic dysfunction of cervical region: Secondary | ICD-10-CM | POA: Diagnosis not present

## 2022-06-28 DIAGNOSIS — M9903 Segmental and somatic dysfunction of lumbar region: Secondary | ICD-10-CM | POA: Diagnosis not present

## 2022-06-28 DIAGNOSIS — M9902 Segmental and somatic dysfunction of thoracic region: Secondary | ICD-10-CM | POA: Diagnosis not present

## 2022-06-28 DIAGNOSIS — M9904 Segmental and somatic dysfunction of sacral region: Secondary | ICD-10-CM | POA: Diagnosis not present

## 2022-07-01 DIAGNOSIS — Z419 Encounter for procedure for purposes other than remedying health state, unspecified: Secondary | ICD-10-CM | POA: Diagnosis not present

## 2022-07-04 DIAGNOSIS — M9904 Segmental and somatic dysfunction of sacral region: Secondary | ICD-10-CM | POA: Diagnosis not present

## 2022-07-04 DIAGNOSIS — M9901 Segmental and somatic dysfunction of cervical region: Secondary | ICD-10-CM | POA: Diagnosis not present

## 2022-07-04 DIAGNOSIS — M9902 Segmental and somatic dysfunction of thoracic region: Secondary | ICD-10-CM | POA: Diagnosis not present

## 2022-07-04 DIAGNOSIS — M9903 Segmental and somatic dysfunction of lumbar region: Secondary | ICD-10-CM | POA: Diagnosis not present

## 2022-07-08 ENCOUNTER — Ambulatory Visit
Admission: RE | Admit: 2022-07-08 | Discharge: 2022-07-08 | Disposition: A | Discharge status: Home / Self Care | Payer: BC Managed Care – PPO | Source: Ambulatory Visit | Attending: Physician Assistant | Admitting: Physician Assistant

## 2022-07-08 DIAGNOSIS — N644 Mastodynia: Secondary | ICD-10-CM | POA: Diagnosis not present

## 2022-08-01 DIAGNOSIS — Z419 Encounter for procedure for purposes other than remedying health state, unspecified: Secondary | ICD-10-CM | POA: Diagnosis not present

## 2022-09-01 DIAGNOSIS — Z419 Encounter for procedure for purposes other than remedying health state, unspecified: Secondary | ICD-10-CM | POA: Diagnosis not present

## 2022-09-29 DIAGNOSIS — M9904 Segmental and somatic dysfunction of sacral region: Secondary | ICD-10-CM | POA: Diagnosis not present

## 2022-09-29 DIAGNOSIS — M9910 Subluxation complex (vertebral) of head region: Secondary | ICD-10-CM | POA: Diagnosis not present

## 2022-09-29 DIAGNOSIS — M9903 Segmental and somatic dysfunction of lumbar region: Secondary | ICD-10-CM | POA: Diagnosis not present

## 2022-09-29 DIAGNOSIS — M9902 Segmental and somatic dysfunction of thoracic region: Secondary | ICD-10-CM | POA: Diagnosis not present

## 2022-09-30 DIAGNOSIS — Z419 Encounter for procedure for purposes other than remedying health state, unspecified: Secondary | ICD-10-CM | POA: Diagnosis not present

## 2022-10-04 DIAGNOSIS — M9904 Segmental and somatic dysfunction of sacral region: Secondary | ICD-10-CM | POA: Diagnosis not present

## 2022-10-04 DIAGNOSIS — M9902 Segmental and somatic dysfunction of thoracic region: Secondary | ICD-10-CM | POA: Diagnosis not present

## 2022-10-04 DIAGNOSIS — M9903 Segmental and somatic dysfunction of lumbar region: Secondary | ICD-10-CM | POA: Diagnosis not present

## 2022-10-04 DIAGNOSIS — M9910 Subluxation complex (vertebral) of head region: Secondary | ICD-10-CM | POA: Diagnosis not present

## 2022-10-07 ENCOUNTER — Telehealth: Payer: Self-pay

## 2022-10-07 NOTE — Telephone Encounter (Signed)
Mychart msg sent

## 2022-10-31 DIAGNOSIS — Z419 Encounter for procedure for purposes other than remedying health state, unspecified: Secondary | ICD-10-CM | POA: Diagnosis not present

## 2022-11-30 DIAGNOSIS — Z419 Encounter for procedure for purposes other than remedying health state, unspecified: Secondary | ICD-10-CM | POA: Diagnosis not present

## 2022-12-31 DIAGNOSIS — Z419 Encounter for procedure for purposes other than remedying health state, unspecified: Secondary | ICD-10-CM | POA: Diagnosis not present

## 2023-01-17 IMAGING — MG DIGITAL DIAGNOSTIC BILAT W/ TOMO W/ CAD
8 series · 8 of 24 positions shown · non-contrast
Comparison: Previous exam(s).

CLINICAL DATA: Short-term follow-up for a probably benign left
breast mass, initially assessed on 12/08/2020.

EXAM:
DIGITAL DIAGNOSTIC BILATERAL MAMMOGRAM WITH TOMOSYNTHESIS AND CAD;
ULTRASOUND LEFT BREAST LIMITED
TECHNIQUE: Bilateral digital diagnostic mammography and breast tomosynthesis
was performed. The images were evaluated with computer-aided
detection.; Targeted ultrasound examination of the left breast was
performed.

[R MLO synth-2D]
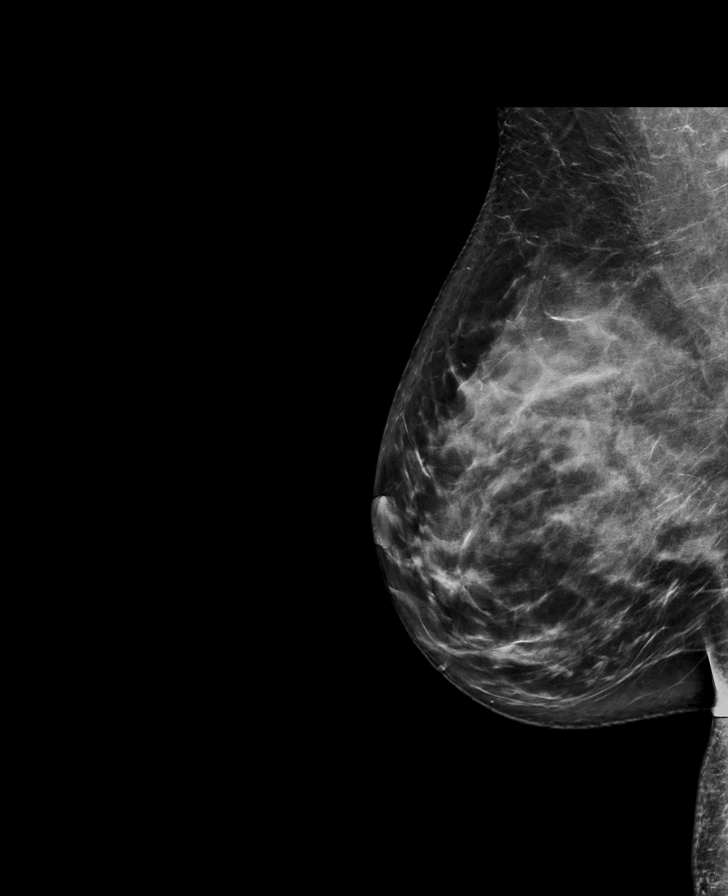

[R CC synth-2D]
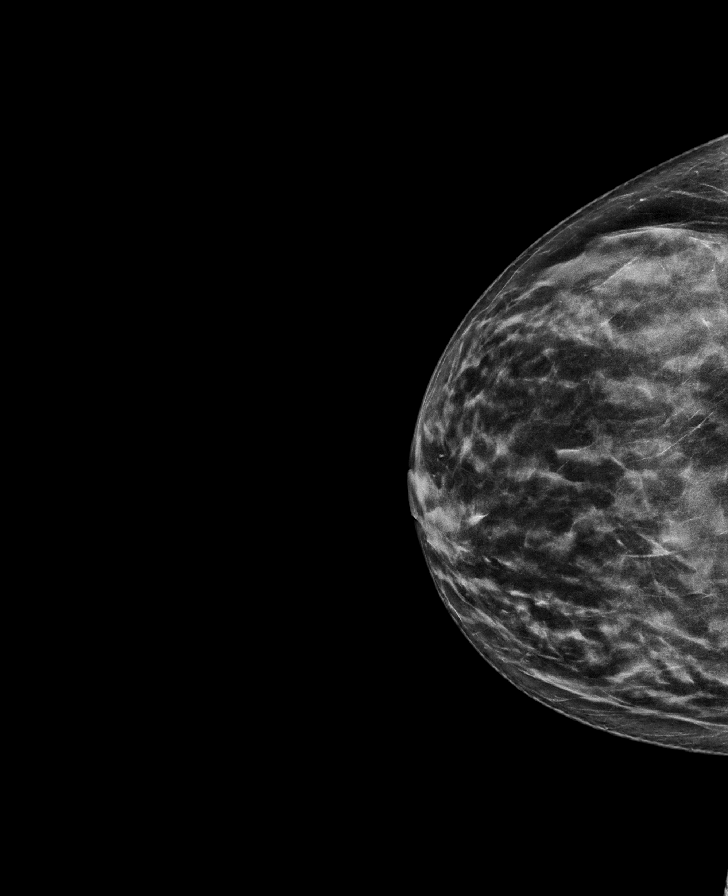

[L MLO synth-2D]
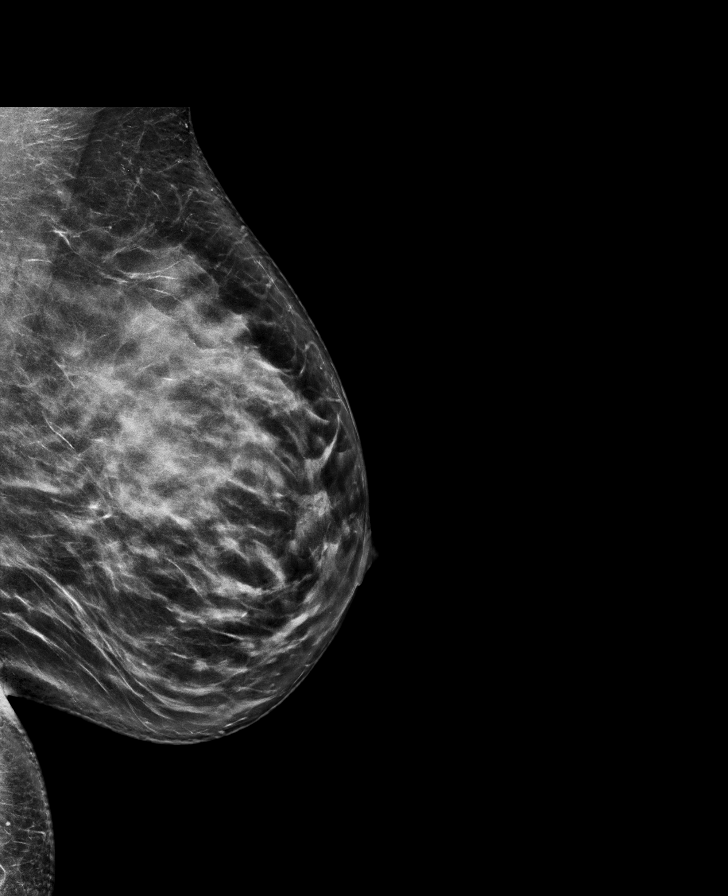

[L CC synth-2D]
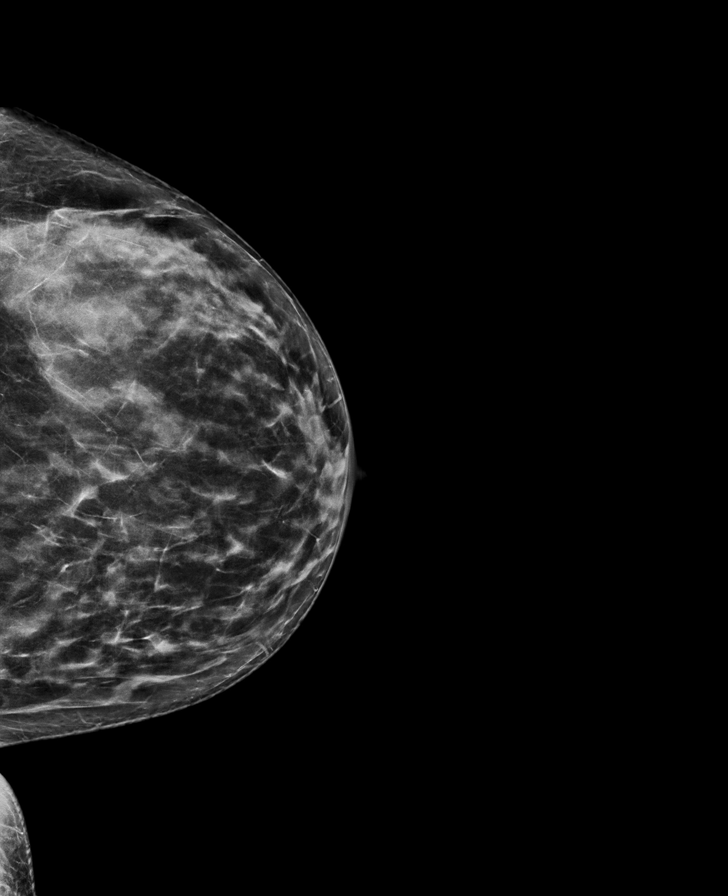

[R CC tomo · tomo slice 35/69.0]
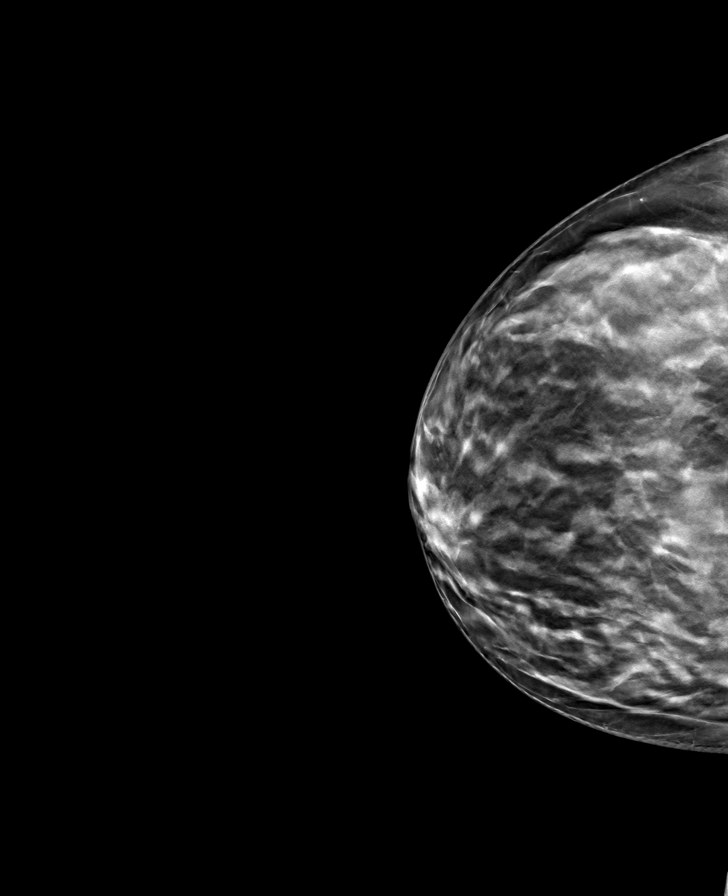

[L MLO tomo · tomo slice 41/82.0]
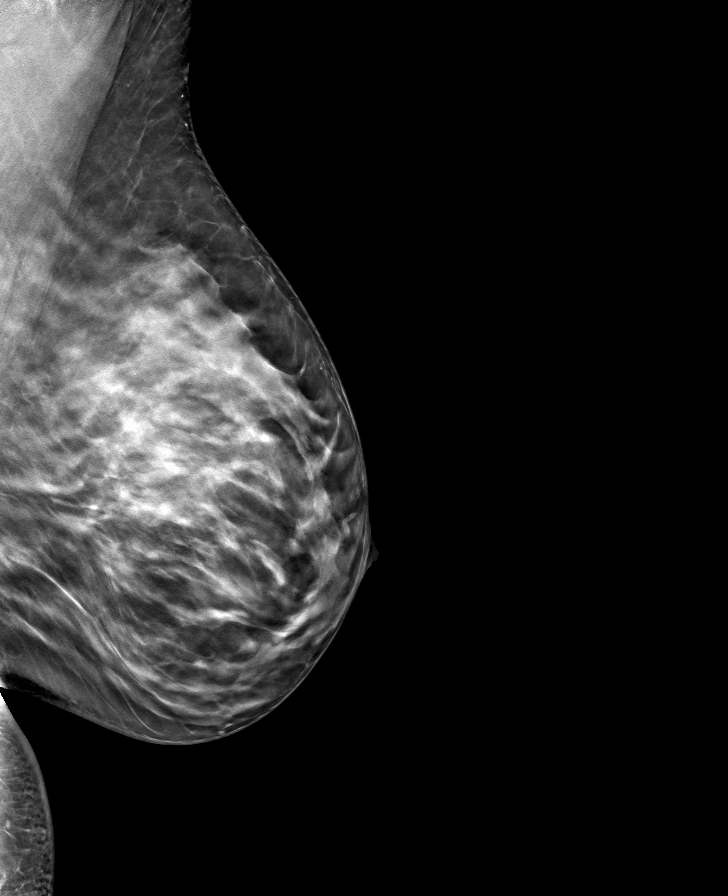

[R MLO tomo · tomo slice 43/84.0]
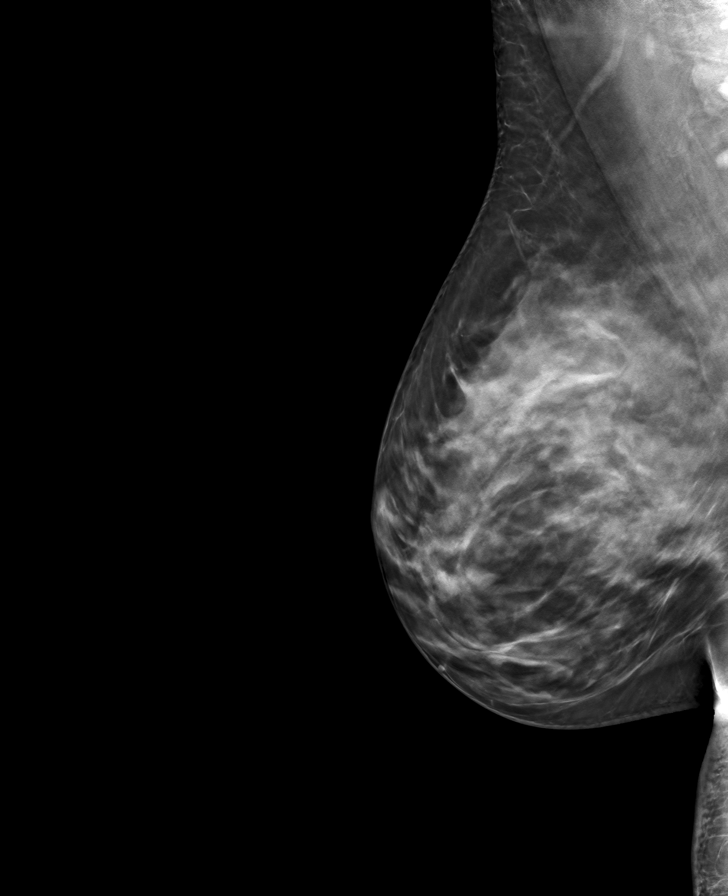

[L CC tomo · tomo slice 37/73.0]
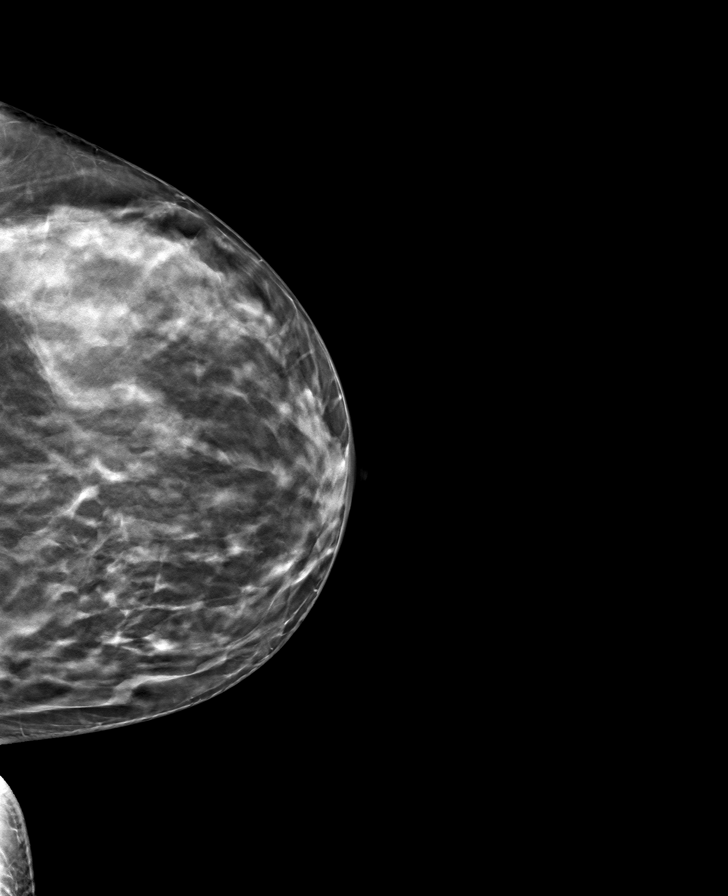

[8 of 24 positions shown; findings below may reference images not displayed]

ACR Breast Density Category c: The breast tissue is heterogeneously
dense, which may obscure small masses.
FINDINGS: The mass initially noted in the posterior upper outer left breast is
unchanged. There are no new masses, no areas of architectural
distortion, no significant asymmetries and no suspicious
calcifications.

Targeted left breast ultrasound is performed, showing a simple cyst
at 2:30 o'clock, 7 cm the nipple, measuring 1.9 x 0.8 x 1.7 cm,
corresponding to the mammographically visible mass. This is
unchanged. Adjacent to this is a smaller oval, nearly anechoic,
circumscribed mass with a thin, smooth wall and increased through
transmission, measuring 7 x 5 x 6 mm, unchanged from the prior
exams. Other than a few subtle internal echoes, that may be
artifact, this is consistent with a cyst with no significant
complicating features.
IMPRESSION: 1. No evidence of breast malignancy.
2. Benign left breast cysts.

RECOMMENDATION:
Screening mammogram in one year.(Code:CT-3-LZF)

I have discussed the findings and recommendations with the patient.
If applicable, a reminder letter will be sent to the patient
regarding the next appointment.

BI-RADS CATEGORY  2: Benign.

## 2023-01-17 IMAGING — US US BREAST*L* LIMITED INC AXILLA
1 series · 12 of 12 positions shown · non-contrast
Comparison: Previous exam(s).

CLINICAL DATA: Short-term follow-up for a probably benign left
breast mass, initially assessed on 12/08/2020.

EXAM:
DIGITAL DIAGNOSTIC BILATERAL MAMMOGRAM WITH TOMOSYNTHESIS AND CAD;
ULTRASOUND LEFT BREAST LIMITED
TECHNIQUE: Bilateral digital diagnostic mammography and breast tomosynthesis
was performed. The images were evaluated with computer-aided
detection.; Targeted ultrasound examination of the left breast was
performed.

[Series 1: us breast*left* limited inc axilla · 0.06mm/px · 12 of 12 slices shown]
[im 1/12]
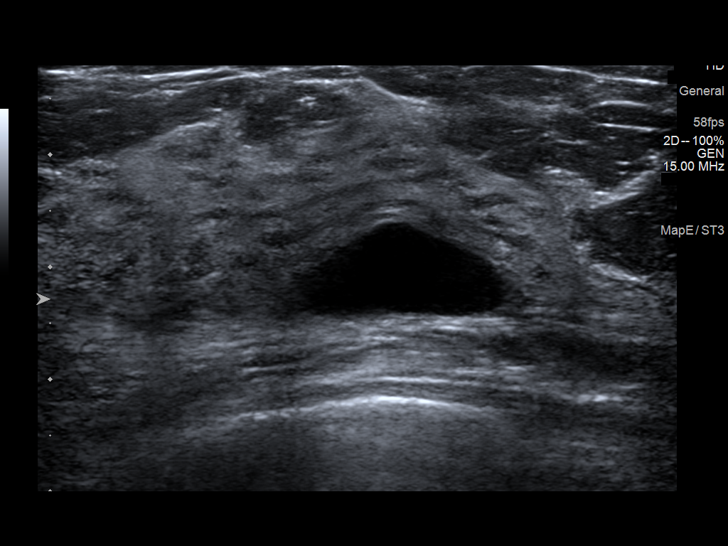
[im 2/12]
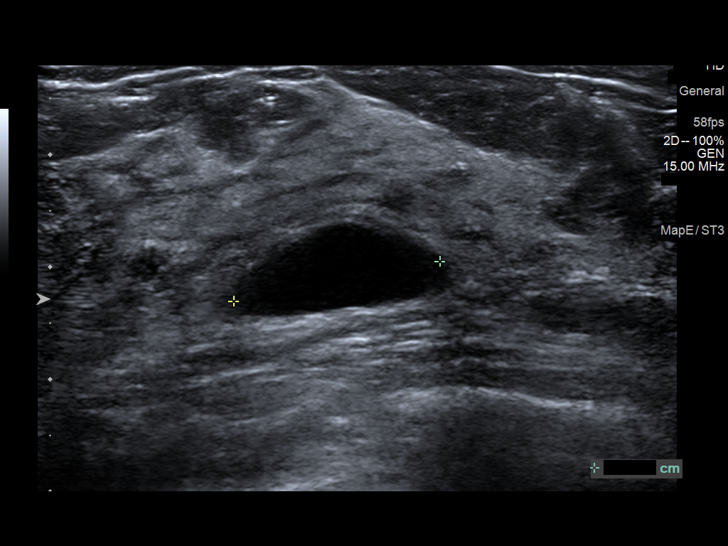
[im 3/12]
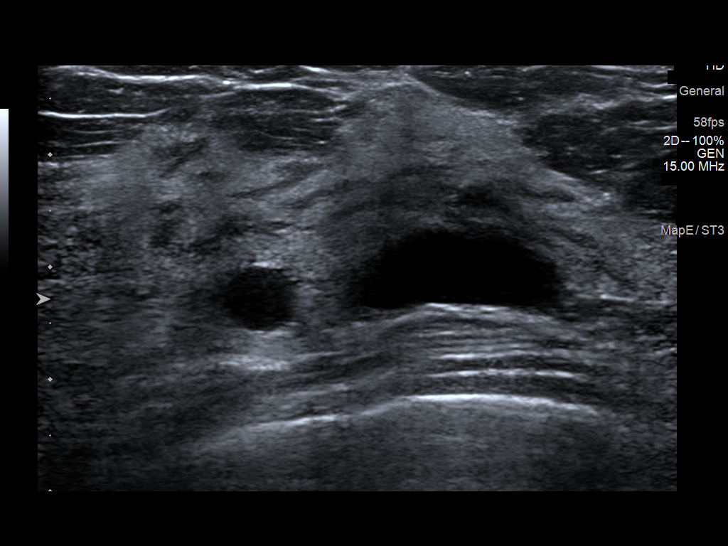
[im 4/12]
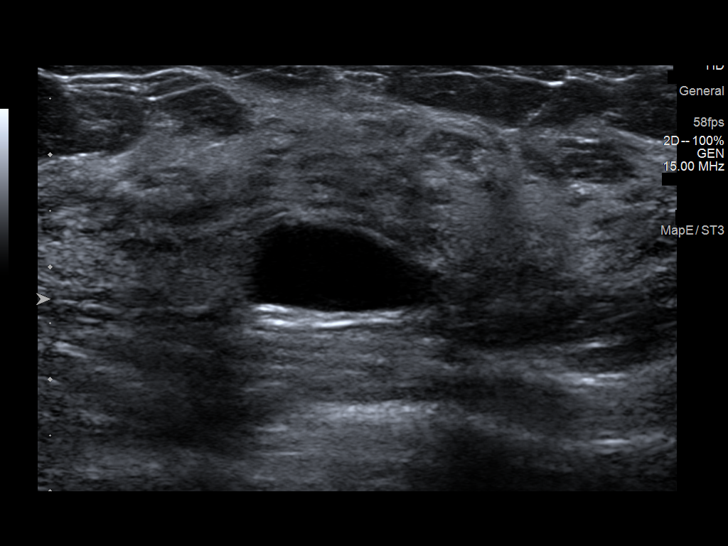
[im 5/12]
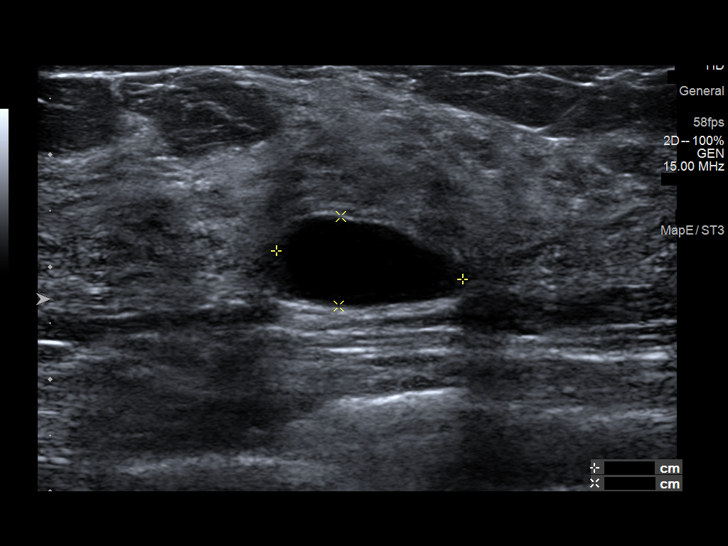
[im 6/12]
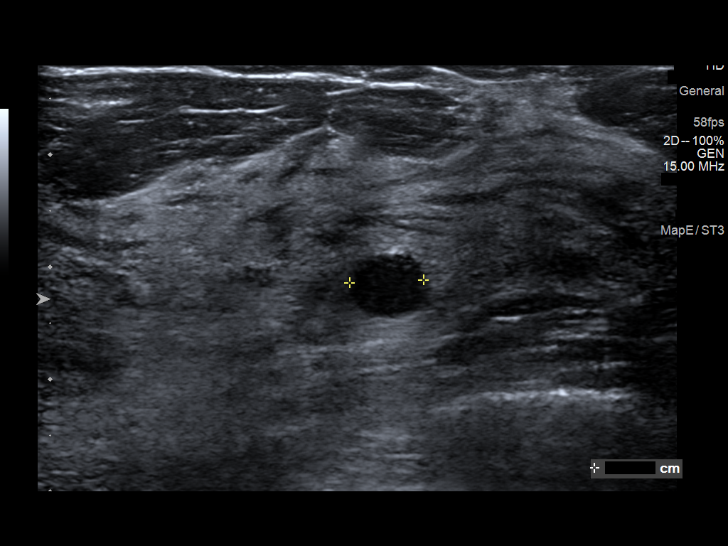
[im 7/12]
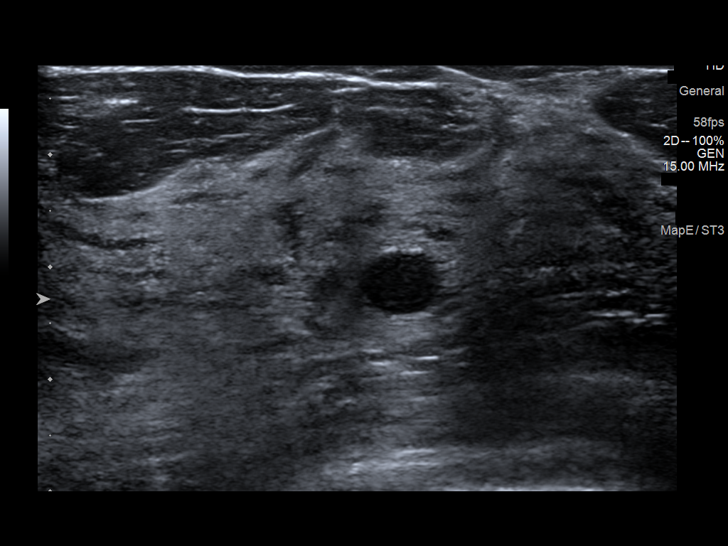
[im 8/12]
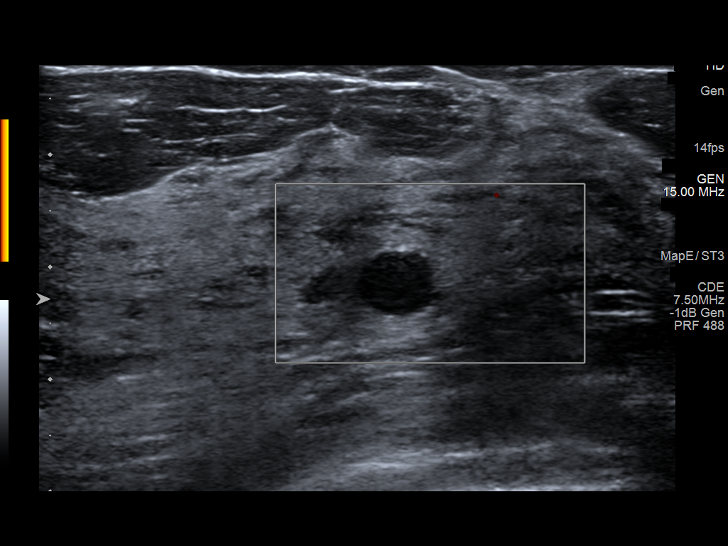
[im 9/12]
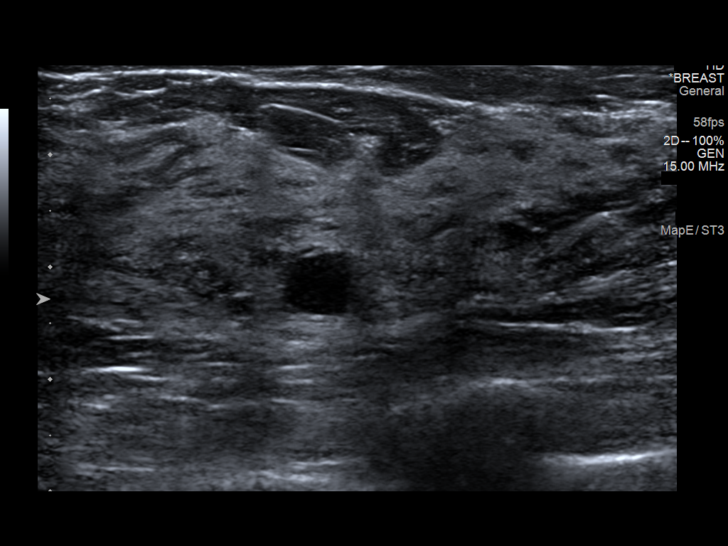
[im 10/12]
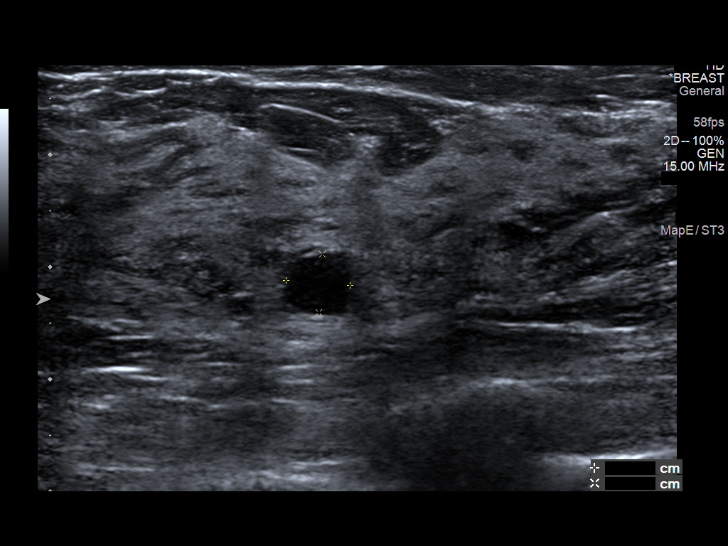
[im 11/12]
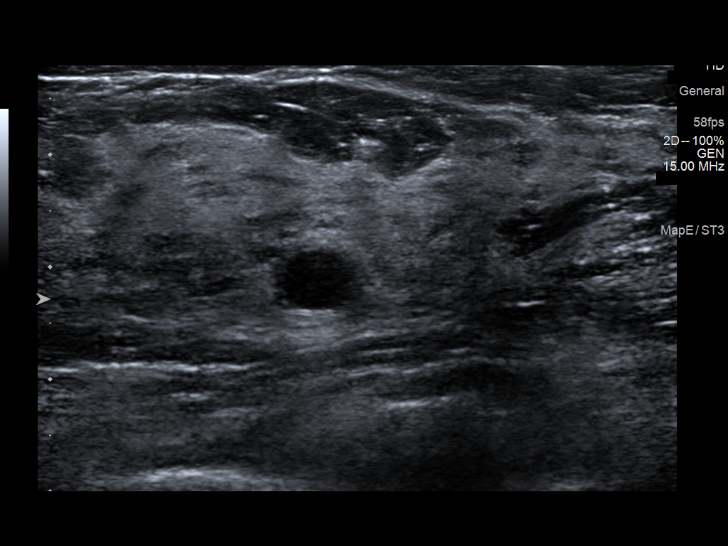
[im 12/12]
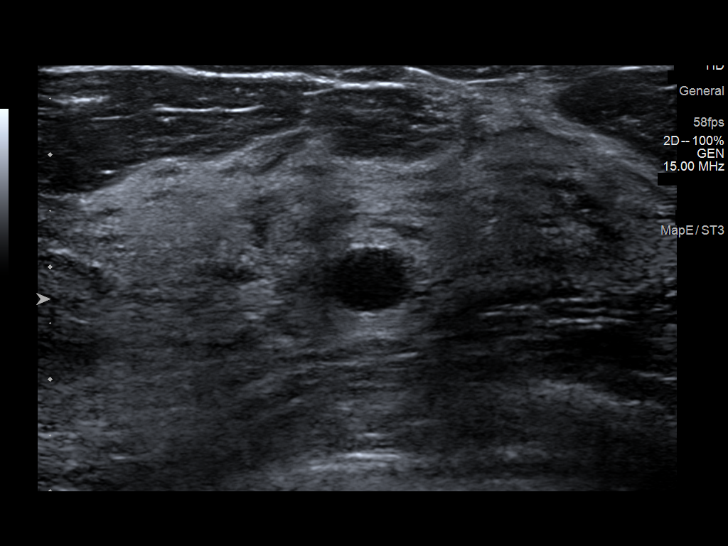

[12 of 12 positions shown; findings below may reference images not displayed]

ACR Breast Density Category c: The breast tissue is heterogeneously
dense, which may obscure small masses.
FINDINGS: The mass initially noted in the posterior upper outer left breast is
unchanged. There are no new masses, no areas of architectural
distortion, no significant asymmetries and no suspicious
calcifications.

Targeted left breast ultrasound is performed, showing a simple cyst
at 2:30 o'clock, 7 cm the nipple, measuring 1.9 x 0.8 x 1.7 cm,
corresponding to the mammographically visible mass. This is
unchanged. Adjacent to this is a smaller oval, nearly anechoic,
circumscribed mass with a thin, smooth wall and increased through
transmission, measuring 7 x 5 x 6 mm, unchanged from the prior
exams. Other than a few subtle internal echoes, that may be
artifact, this is consistent with a cyst with no significant
complicating features.
IMPRESSION: 1. No evidence of breast malignancy.
2. Benign left breast cysts.

RECOMMENDATION:
Screening mammogram in one year.(Code:CT-3-LZF)

I have discussed the findings and recommendations with the patient.
If applicable, a reminder letter will be sent to the patient
regarding the next appointment.

BI-RADS CATEGORY  2: Benign.

## 2023-01-30 DIAGNOSIS — Z419 Encounter for procedure for purposes other than remedying health state, unspecified: Secondary | ICD-10-CM | POA: Diagnosis not present

## 2023-02-13 DIAGNOSIS — L299 Pruritus, unspecified: Secondary | ICD-10-CM | POA: Diagnosis not present

## 2023-03-02 DIAGNOSIS — Z419 Encounter for procedure for purposes other than remedying health state, unspecified: Secondary | ICD-10-CM | POA: Diagnosis not present

## 2023-03-22 DIAGNOSIS — Z0000 Encounter for general adult medical examination without abnormal findings: Secondary | ICD-10-CM | POA: Diagnosis not present

## 2023-03-22 DIAGNOSIS — Z131 Encounter for screening for diabetes mellitus: Secondary | ICD-10-CM | POA: Diagnosis not present

## 2023-03-22 DIAGNOSIS — E7800 Pure hypercholesterolemia, unspecified: Secondary | ICD-10-CM | POA: Diagnosis not present

## 2023-03-22 DIAGNOSIS — Z124 Encounter for screening for malignant neoplasm of cervix: Secondary | ICD-10-CM | POA: Diagnosis not present

## 2023-03-23 ENCOUNTER — Encounter: Payer: Self-pay | Admitting: Allergy

## 2023-03-23 ENCOUNTER — Other Ambulatory Visit: Payer: Self-pay

## 2023-03-23 ENCOUNTER — Ambulatory Visit (INDEPENDENT_AMBULATORY_CARE_PROVIDER_SITE_OTHER): Payer: BC Managed Care – PPO | Admitting: Allergy

## 2023-03-23 VITALS — BP 130/90 | HR 80 | Temp 98.2°F | Ht 64.57 in | Wt 181.6 lb

## 2023-03-23 DIAGNOSIS — T7800XD Anaphylactic reaction due to unspecified food, subsequent encounter: Secondary | ICD-10-CM

## 2023-03-23 DIAGNOSIS — H1013 Acute atopic conjunctivitis, bilateral: Secondary | ICD-10-CM

## 2023-03-23 DIAGNOSIS — J3089 Other allergic rhinitis: Secondary | ICD-10-CM | POA: Diagnosis not present

## 2023-03-23 DIAGNOSIS — T783XXD Angioneurotic edema, subsequent encounter: Secondary | ICD-10-CM

## 2023-03-23 DIAGNOSIS — T781XXD Other adverse food reactions, not elsewhere classified, subsequent encounter: Secondary | ICD-10-CM

## 2023-03-23 DIAGNOSIS — J302 Other seasonal allergic rhinitis: Secondary | ICD-10-CM

## 2023-03-23 MED ORDER — EPINEPHRINE 0.3 MG/0.3ML IJ SOAJ
0.3000 mg | INTRAMUSCULAR | 2 refills | Status: AC | PRN
Start: 2023-03-23 — End: ?

## 2023-03-23 MED ORDER — OLOPATADINE HCL 0.2 % OP SOLN: 1.0000 [drp] | Freq: Every day | OPHTHALMIC | 5 refills | Status: DC | PRN

## 2023-03-23 MED ORDER — FEXOFENADINE HCL 180 MG PO TABS
180.0000 mg | ORAL_TABLET | Freq: Every day | ORAL | 5 refills | Status: DC
Start: 2023-03-23 — End: 2023-08-04

## 2023-03-23 MED ORDER — TRIAMCINOLONE ACETONIDE 0.5 % EX OINT: 1.0000 | TOPICAL_OINTMENT | Freq: Two times a day (BID) | CUTANEOUS | 5 refills | Status: AC

## 2023-03-23 MED ORDER — RYALTRIS 665-25 MCG/ACT NA SUSP
2.0000 | Freq: Two times a day (BID) | NASAL | 5 refills | Status: DC
Start: 2023-03-23 — End: 2023-08-04

## 2023-03-23 NOTE — Patient Instructions (Signed)
-   Testing today showed: grasses, ragweed, weeds, trees, outdoor molds, cat, dog, cockroach, and mixed feathers - Copy of test results provided.  - Avoidance measures provided. - Continue with: Allegra (fexofenadine) 180mg  tablet once daily - Start taking:  Pataday 1 drop each eye daily as needed for itchy/watery eyes Ryaltris nasal spray 2 sprays each nostril twice a day for nasal congestion or drainage - You can use an extra dose of the antihistamine, if needed, for breakthrough symptoms.  - Consider nasal saline rinses 1-2 times daily to remove allergens from the nasal cavities as well as help with mucous clearance (this is especially helpful to do before the nasal sprays are given) - Consider allergy shots as a means of long-term control. - Allergy shots "re-train" and "reset" the immune system to ignore environmental allergens and decrease the resulting immune response to those allergens (sneezing, itchy watery eyes, runny nose, nasal congestion, etc).    - Allergy shots improve symptoms in 75-85% of patients.  - We can discuss more at the next appointment if the medications are not working for you.  - Continue use of bug repellants - If bitten do the following: Ice affected area Oral antihistamine (Benadryl or Allegra) Oral anti-inflammatory (ibuprofen) Topical corticosteroid (Triamcinolone ointment) - Fire ant skin testing is positive - Will obtain stinging insect panel via blood work (this looks at allergy to wasp/hornet/yellow jacket etc)  - Cherry testing is positive today.  Shrimp and apple testing is negative today.  Will obtain serum IgE level to shrimp and if negative or low you will be eligible for in-office food challenge to shrimp to determine if you are no longer allergic - Recommend you have access to self-injectable epinephrine (Epipen or AuviQ) 0.3mg  at all times - Follow emergency action plan in case of allergic reaction - Continue avoidance of fresh apple, cherry,  shrimp (for now)  Follow-up in 4-6 months or sooner if needed

## 2023-03-23 NOTE — Progress Notes (Signed)
New Patient Note  RE: Lori Foster MRN: [id number] DOB: 01-08-1976 Date of Office Visit: 03/23/2023  Primary care provider: Milus Height, PA  Chief Complaint: Mosquito allergy  History of present illness: Lori Foster is a 47 y.o. female presenting today for evaluation of reaction to bug bites and shrimp.  She states she grew up eating shrimp.  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 She had it at her birthday dinner and she developed itching.  The next day was grilling shrimp and she broke out in hives. Thought it was a bad batch of shrimp.  She went to grocery store and got another batch of shrimp and she still had itching and hives.  She went to ED with this reaction.  She was told at that time she was allergic shrimp.  She has not had shrimp since then.  She does eat crab, scallop, lobster without issue.  She would like to be able to eat shrimp again is she is no longer allergic.   She states with bug bites she develops large swelling at the bite site and starts itching badly all over.  She states she has dark marks from where she has had bug bite.  One time she went to [facility] for the swelling and itching.  She states she had to take like 6 benadryl which didn't touch her symptoms.  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  She states she recalls as a kid falling into a fire ant pile and she recalls her foot became very swollen.  2 months she was stung by a wasp on her foot and she develop foot swelling and up the leg some but did not reach the knee.   With apple and black cherries her throat gets very itchy.  She has had apple pie without issue.    She has issues with itchy/watery eyes, runny/stuffy nose, sneezing year-round symptoms.  She will alternate between allegra and claritin.  Will use flonase as needed for runny or stuffy nose.    No history of asthma or eczema.    Review of systems: 10pt ROS negative unless noted above in HPI   Past medical  history: Past Medical History:  Diagnosis Date   Hyperlipidemia     Past surgical history: Past Surgical History:  Procedure Laterality Date   LEAP  2013    Family history:  Family History  Problem Relation Age of Onset   Breast cancer Mother 83    Social history: Lives in a home with carpeting in the bedroom with gas heating and central cooling.  1 dog and 2 [place] pigs in the home.  There is no concern for water damage, mildew or roaches in the home.  She is a Manufacturing systems engineer.  She denies a smoking history.   Medication List: Current Outpatient Medications  Medication Sig Dispense Refill   ibuprofen (ADVIL,MOTRIN) 200 MG tablet Take 400 mg by mouth every 6 (six) hours as needed for headache (pain).     cyclobenzaprine (FLEXERIL) 10 MG tablet Take 1 tablet (10 mg total) by mouth 3 (three) times daily as needed for muscle spasms. (Patient not taking: Reported on 03/23/2023) 15 tablet 0   HYDROcodone-acetaminophen (NORCO) 5-325 MG tablet Take 1 tablet by mouth every 6 (six) hours as needed for severe pain. (Patient not taking: Reported on 03/23/2023) 10 tablet 0   naproxen (NAPROSYN) 500 MG tablet Take 1 tablet (500 mg total) by mouth 2 (two) times  daily as needed for mild pain, moderate pain or headache (TAKE WITH MEALS.). (Patient not taking: Reported on 03/23/2023) 20 tablet 0   naproxen sodium (ALEVE) 220 MG tablet Take 2 tablets (440 mg total) by mouth 2 (two) times daily as needed (pain). (Patient not taking: Reported on 03/23/2023) 20 tablet 0   No current facility-administered medications for this visit.    Known medication allergies: Allergies  Allergen Reactions   Shrimp [Shellfish Allergy] Hives, Itching and Swelling     Physical examination: Blood pressure (!) 130/90, pulse 80, temperature 98.2 F (36.8 C), temperature source Temporal, height 5' 4.57" (1.64 m), weight 181 lb 9.6 oz (82.4 kg), SpO2 98%.  General: Alert, interactive, in no acute distress. HEENT:  PERRLA, TMs pearly gray, turbinates minimally edematous without discharge, post-pharynx non erythematous. Neck: Supple without lymphadenopathy. Lungs: Clear to auscultation without wheezing, rhonchi or rales. {no increased work of breathing. CV: Normal S1, S2 without murmurs. Abdomen: Nondistended, nontender. Skin: Warm and dry, without lesions or rashes. Extremities:  No clubbing, cyanosis or edema. Neuro:   Grossly intact.  Diagnositics/Labs:  Allergy testing:   Airborne Adult Perc - 03/23/23 1427     Time Antigen Placed 1427    Allergen Manufacturer Waynette Buttery    Location Back    Number of Test 55    1. Control-Buffer 50% Glycerol Negative    2. Control-Histamine 2+    3. Bahia 2+    4. [place] 2+    5. Johnson Negative    6. Kentucky Blue 2+    7. Meadow Fescue 2+    8. Perennial Rye 2+    9. Timothy 2+    10. Ragweed Mix 2+    11. Cocklebur 2+    12. Plantain,  English 2+    13. Baccharis 2+    14. Dog Fennel Negative    15. Guernsey Thistle 2+    16. Lamb's Quarters 2+    17. Sheep Sorrell 2+    18. Rough Pigweed Negative    19. Marsh Elder, Rough 2+    20. Mugwort, Common 2+    21. Box, Elder 3+    22. Cedar, red 2+    23. Sweet Gum 4+    24. Pecan Pollen 2+    25. Pine Mix Negative    26. Walnut, Black Pollen Negative    27. Red Mulberry 2+    28. Ash Mix 3+    29. Birch Mix 4+    30. Beech American 4+    31. Cottonwood, [place] 2+    32. Hickory, White 3+    33. Maple Mix 3+    34. Oak, [place] Mix 4+    35. Sycamore Eastern 2+    36. Alternaria Alternata Negative    37. Cladosporium Herbarum Negative    38. Aspergillus Mix Negative    39. Penicillium Mix Negative    40. Bipolaris Sorokiniana (Helminthosporium) Negative    41. Drechslera Spicifera (Curvularia) Negative    42. Mucor Plumbeus 2+    43. Fusarium Moniliforme 2+    44. Aureobasidium Pullulans (pullulara) 2+    45. Rhizopus Oryzae Negative    46. Botrytis Cinera Negative    47. Epicoccum  Nigrum Negative    48. Phoma Betae Negative    49. Dust Mite Mix Negative    50. Cat Hair 10,000 BAU/ml 3+    51.  Dog Epithelia 2+    52. Mixed Feathers 2+    53. Horse Epithelia  Negative    54. Cockroach, German 3+    55. Tobacco Leaf Negative    1. Fire [employer] 2+             Food Adult Perc - 03/23/23 1400     Time Antigen Placed 0241    Allergen Manufacturer Waynette Buttery    Location Back    Number of allergen test 3    23. Shrimp Negative    58. Apple Negative    62. Cherry 2+             Allergy testing results were read and interpreted by provider, documented by clinical staff.   Assessment and plan: Allergic rhinitis with conjunctivitis  - Testing today showed: grasses, ragweed, weeds, trees, outdoor molds, cat, dog, cockroach, and mixed feathers - Copy of test results provided.  - Avoidance measures provided. - Continue with: Allegra (fexofenadine) 180mg  tablet once daily - Start taking:  Pataday 1 drop each eye daily as needed for itchy/watery eyes Ryaltris nasal spray 2 sprays each nostril twice a day for nasal congestion or drainage - You can use an extra dose of the antihistamine, if needed, for breakthrough symptoms.  - Consider nasal saline rinses 1-2 times daily to remove allergens from the nasal cavities as well as help with mucous clearance (this is especially helpful to do before the nasal sprays are given) - Consider allergy shots as a means of long-term control. - Allergy shots "re-train" and "reset" the immune system to ignore environmental allergens and decrease the resulting immune response to those allergens (sneezing, itchy watery eyes, runny nose, nasal congestion, etc).    - Allergy shots improve symptoms in 75-85% of patients.  - We can discuss more at the next appointment if the medications are not working for you.  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  Shrimp and apple testing is negative today.  Will obtain serum IgE level to shrimp and if negative or low you will be eligible for in-office food challenge to shrimp to determine if you are no longer allergic - Recommend you have access to self-injectable epinephrine (Epipen or AuviQ) 0.3mg  at all times - Follow emergency action plan in case of allergic reaction - Continue avoidance of fresh apple, cherry, shrimp (for now)  Follow-up in 4-6 months or sooner if needed  I appreciate the opportunity to take part in [place] care. Please do not hesitate to contact me with questions.  Sincerely,   Margo Aye, MD Allergy/Immunology [facility]

## 2023-03-29 ENCOUNTER — Telehealth: Payer: Self-pay

## 2023-03-29 LAB — HYMENOPTERA VENOM ALLERGY II
Bumblebee: <0.1 kU/L
Hornet, White Face, IgE: 0.15 kU/L — AB
Hornet, Yellow, IgE: 0.17 kU/L — AB
I001-IgE Honeybee: <0.1 kU/L
I003-IgE Yellow Jacket: 0.56 kU/L — AB
I004-IgE Paper Wasp: 1.47 kU/L — AB
I208-IgE Api m 1: <0.1 kU/L
I209-IgE Ves v 5: 1.15 kU/L — AB
I210-IgE Pol d 5: 7.91 kU/L — AB
I211-IgE Ves v 1: <0.1 kU/L
I214-IgE Api m 2: <0.1 kU/L
I215-IgE Api m 3: <0.1 kU/L
I216-IgE Api m 5: <0.1 kU/L
I217-IgE Api m 10: <0.1 kU/L
Tryptase: 3.2 ug/L (ref 2.2–13.2)

## 2023-03-29 LAB — IGE: IgE (Immunoglobulin E), Serum: 850 [IU]/mL — ABNORMAL HIGH (ref 6–495)

## 2023-03-29 LAB — ALLERGEN, SHRIMP F24: Shrimp IgE: 0.11 kU/L — AB

## 2023-03-29 LAB — ALLERGEN COMPONENT COMMENTS

## 2023-03-29 NOTE — Telephone Encounter (Signed)
*  Asthma/Allergy  Pharmacy Patient Advocate Encounter   Received notification from CoverMyMeds that prior authorization for Ryaltris 665-25MCG/ACT suspension  is required/requested.   Insurance verification completed.   The patient is insured through [facility] .   Per test claim: PA required; PA started via CoverMyMeds. KEY [id number] . Waiting for clinical questions to populate.

## 2023-03-29 NOTE — Telephone Encounter (Signed)
Pharmacy Patient Advocate Encounter  Received notification from [facility] that Prior Authorization for Ryaltris has been DENIED. Please advise how you'd like to proceed. Full denial letter will be uploaded to the media tab. See denial reason below.   [employer] received your recent request for coverage of RYALTRIS 665-25 SPR. Your request was denied based on the terms of your prescription benefit plan. This is the initial adverse coverage determination for this request. The reason for the denial was: *Non-Formulary- Your request for coverage was denied because your prescription benefit plan does not cover the requested medication. Marland Kitchen

## 2023-03-30 NOTE — Telephone Encounter (Signed)
I called patient and informed of nasal sprays. Patient asked about her lab results. I informed they have came back but that Dr.Padgett has not resulted her labs yet. I informed we would call her back once we get her results back.

## 2023-04-02 DIAGNOSIS — Z419 Encounter for procedure for purposes other than remedying health state, unspecified: Secondary | ICD-10-CM | POA: Diagnosis not present

## 2023-04-11 ENCOUNTER — Other Ambulatory Visit: Payer: Self-pay | Admitting: Physician Assistant

## 2023-04-11 DIAGNOSIS — Z1231 Encounter for screening mammogram for malignant neoplasm of breast: Secondary | ICD-10-CM

## 2023-04-13 ENCOUNTER — Ambulatory Visit
Admission: RE | Admit: 2023-04-13 | Discharge: 2023-04-13 | Disposition: A | Discharge status: Home / Self Care | Payer: BC Managed Care – PPO | Source: Ambulatory Visit | Attending: Physician Assistant | Admitting: Physician Assistant

## 2023-04-13 DIAGNOSIS — Z1231 Encounter for screening mammogram for malignant neoplasm of breast: Secondary | ICD-10-CM | POA: Diagnosis not present

## 2023-05-02 DIAGNOSIS — Z419 Encounter for procedure for purposes other than remedying health state, unspecified: Secondary | ICD-10-CM | POA: Diagnosis not present

## 2023-05-02 NOTE — Patient Instructions (Incomplete)
Lori Foster was able to tolerate the shrimp food challenge today at the office without adverse signs or symptoms of an allergic reaction. Therefore, she has the same risk of systemic reaction associated with the consumption of shrimp products as the general population.  - Do not give any shellfish products for the next 24 hours. - Monitor for allergic symptoms such as rash, wheezing, diarrhea, swelling, and vomiting for the next 24 hours. If severe symptoms occur, treat with EpiPen injection and call 911. For less severe symptoms treat with Benadryl 4 teaspoonfuls every 4-6 hours and call the clinic.  - If no allergic symptoms are evident, reintroduce shrimp products into the diet, 1-2 servings a day. If she develops an allergic reaction to shrimp products, record what was eaten, the amount eaten, preparation method, time from ingestion to reaction, and symptoms.   Continue to avoid fresh apple and cherry. In case of an allergic reaction, give Benadryl 4 teaspoonfuls every 4 hours, and if life-threatening symptoms occur, inject with EpiPen 0.3 mg.  Your blood pressure was elevated while in the office today.  Please schedule an appointment with your primary care physician to discuss your elevated blood pressure  Keep your already scheduled follow-up appointment on August 04, 2023 at 3 PM with Dr. Delorse Lek or sooner if needed

## 2023-05-03 ENCOUNTER — Encounter: Payer: Self-pay | Admitting: Family

## 2023-05-03 ENCOUNTER — Ambulatory Visit (INDEPENDENT_AMBULATORY_CARE_PROVIDER_SITE_OTHER): Payer: BC Managed Care – PPO | Admitting: Family

## 2023-05-03 ENCOUNTER — Other Ambulatory Visit: Payer: Self-pay

## 2023-05-03 VITALS — BP 142/90 | HR 68 | Temp 97.3°F | Resp 12 | Wt 183.1 lb

## 2023-05-03 DIAGNOSIS — T7800XD Anaphylactic reaction due to unspecified food, subsequent encounter: Secondary | ICD-10-CM | POA: Diagnosis not present

## 2023-05-03 NOTE — Progress Notes (Signed)
[address] [place] [address] Dept: [phone]  FOLLOW UP NOTE  Patient ID: Lori Foster, female    DOB: Dec 19, 1975  Age: 47 y.o. MRN: [id number] Date of Office Visit: 05/03/2023  Assessment  Chief Complaint: No chief complaint on file.  HPI Lori Foster is a 47 year old female who presents today for an oral food challenge to shrimp.  She was last seen on March 23, 2023 by Dr. Delorse Lek for angioedema, anaphylaxis due to food, seasonal and perennial allergic rhinitis, allergic conjunctivitis, and pollen food allergy.  She denies any new diagnosis or surgery since her last office visit.  She grew up eating shrimp.  On her 31th birthday she discovered that she was allergic to shrimp.  She had it at her birthday dinner and developed itching.  The next day she was grilling shrimp and she broke out into hives.  She thought that she had a bad batch of shrimp and she went to the grocery store and had another batch of shrimp.  She still had itching and hives.  She went to the emergency room with this reaction and was told that she was allergic to shrimp.  She has not had shrimp since then.  She does eat crab, scallops, and lobster with no issue.  She has been off all antihistamines for the past 3 days and is in good health.  She denies any cardiorespiratory, gastrointestinal, and cutaneous symptoms.  All questions answered and informed consent signed.   Drug Allergies:  Allergies  Allergen Reactions   Shrimp [Shellfish Allergy] Hives, Itching and Swelling    Review of Systems: Negative except as per HPI   Physical Exam: BP (!) 142/90   Pulse 68   Temp (!) 97.3 F (36.3 C) (Temporal)   Resp 12   Wt 183 lb 1.6 oz (83.1 kg)   SpO2 100%   BMI 30.88 kg/m    Physical Exam Constitutional:      Appearance: Normal appearance.  HENT:     Head: Normocephalic and atraumatic.     Comments: Pharynx normal, eyes normal, ears normal, nose: Bilateral lower turbinates mildly edematous  with no drainage noted    Right Ear: Tympanic membrane, ear canal and external ear normal.     Left Ear: Tympanic membrane, ear canal and external ear normal.     Mouth/Throat:     Mouth: Mucous membranes are moist.     Pharynx: Oropharynx is clear.  Eyes:     Conjunctiva/sclera: Conjunctivae normal.  Cardiovascular:     Rate and Rhythm: Regular rhythm.     Heart sounds: Normal heart sounds.  Pulmonary:     Effort: Pulmonary effort is normal.     Breath sounds: Normal breath sounds.     Comments: Lungs clear to auscultation Musculoskeletal:     Cervical back: Neck supple.  Skin:    General: Skin is warm.     Comments: No rashes or urticarial lesions noted.  Neurological:     Mental Status: She is alert and oriented to person, place, and time.  Psychiatric:        Mood and Affect: Mood normal.        Behavior: Behavior normal.        Thought Content: Thought content normal.        Judgment: Judgment normal.     Diagnostics:  Open graded shrimp oral challenge: The patient was able to tolerate the challenge today without adverse signs or symptoms. Vital signs were stable throughout  the challenge and observation period. She received multiple doses separated by 15 minutes, each of which was separated by vitals and a brief physical exam. She received the following doses: lip rub, 0.12 ounces, 0.38 ounces, 0.7 ounces, 1.8 ounces.  Total amount of shrimp consumed: 3 ounces. She was monitored for 60 minutes following the last dose.   The patient had negative skin prick tests to shrimp and Ige to shrimp 0.11 kU/L  and was able to tolerate the open graded oral challenge today without adverse signs or symptoms. Therefore, she has the same risk of systemic reaction associated with the consumption of shrimp  as the general population.   Assessment and Plan: 1. Anaphylaxis due to food, subsequent encounter     No orders of the defined types were placed in this encounter.   Patient  Instructions  Lori Foster was able to tolerate the shrimp food challenge today at the office without adverse signs or symptoms of an allergic reaction. Therefore, she has the same risk of systemic reaction associated with the consumption of shrimp products as the general population.  - Do not give any shellfish products for the next 24 hours. - Monitor for allergic symptoms such as rash, wheezing, diarrhea, swelling, and vomiting for the next 24 hours. If severe symptoms occur, treat with EpiPen injection and call 911. For less severe symptoms treat with Benadryl 4 teaspoonfuls every 4-6 hours and call the clinic.  - If no allergic symptoms are evident, reintroduce shrimp products into the diet, 1-2 servings a day. If she develops an allergic reaction to shrimp products, record what was eaten, the amount eaten, preparation method, time from ingestion to reaction, and symptoms.   Continue to avoid fresh apple and cherry. In case of an allergic reaction, give Benadryl 4 teaspoonfuls every 4 hours, and if life-threatening symptoms occur, inject with EpiPen 0.3 mg.  Your blood pressure was elevated while in the office today.  Please schedule an appointment with your primary care physician to discuss your elevated blood pressure  Keep your already scheduled follow-up appointment on August 04, 2023 at 3 PM with Dr. Delorse Lek or sooner if needed    Return in about 3 months (around 08/04/2023), or if symptoms worsen or fail to improve.    Thank you for the opportunity to care for this patient.  Please do not hesitate to contact me with questions.  Nehemiah Settle, FNP [facility]

## 2023-05-10 DIAGNOSIS — Z1211 Encounter for screening for malignant neoplasm of colon: Secondary | ICD-10-CM | POA: Diagnosis not present

## 2023-05-10 DIAGNOSIS — K648 Other hemorrhoids: Secondary | ICD-10-CM | POA: Diagnosis not present

## 2023-05-10 DIAGNOSIS — K621 Rectal polyp: Secondary | ICD-10-CM | POA: Diagnosis not present

## 2023-07-02 DIAGNOSIS — Z419 Encounter for procedure for purposes other than remedying health state, unspecified: Secondary | ICD-10-CM | POA: Diagnosis not present

## 2023-08-02 DIAGNOSIS — Z419 Encounter for procedure for purposes other than remedying health state, unspecified: Secondary | ICD-10-CM | POA: Diagnosis not present

## 2023-08-04 ENCOUNTER — Encounter: Payer: Self-pay | Admitting: Allergy

## 2023-08-04 ENCOUNTER — Other Ambulatory Visit: Payer: Self-pay

## 2023-08-04 ENCOUNTER — Ambulatory Visit (INDEPENDENT_AMBULATORY_CARE_PROVIDER_SITE_OTHER): Payer: BC Managed Care – PPO | Admitting: Allergy

## 2023-08-04 VITALS — BP 140/92 | HR 77 | Temp 98.2°F | Resp 16

## 2023-08-04 DIAGNOSIS — H1013 Acute atopic conjunctivitis, bilateral: Secondary | ICD-10-CM

## 2023-08-04 DIAGNOSIS — T7800XD Anaphylactic reaction due to unspecified food, subsequent encounter: Secondary | ICD-10-CM

## 2023-08-04 DIAGNOSIS — T63481A Toxic effect of venom of other arthropod, accidental (unintentional), initial encounter: Secondary | ICD-10-CM

## 2023-08-04 DIAGNOSIS — J3089 Other allergic rhinitis: Secondary | ICD-10-CM | POA: Diagnosis not present

## 2023-08-04 DIAGNOSIS — T781XXD Other adverse food reactions, not elsewhere classified, subsequent encounter: Secondary | ICD-10-CM

## 2023-08-04 DIAGNOSIS — R239 Unspecified skin changes: Secondary | ICD-10-CM

## 2023-08-04 DIAGNOSIS — J302 Other seasonal allergic rhinitis: Secondary | ICD-10-CM

## 2023-08-04 MED ORDER — FLONASE SENSIMIST 27.5 MCG/SPRAY NA SUSP: 2.0000 | Freq: Every day | NASAL | 5 refills | Status: AC | PRN

## 2023-08-04 MED ORDER — FEXOFENADINE HCL 180 MG PO TABS
180.0000 mg | ORAL_TABLET | Freq: Every day | ORAL | 5 refills | Status: AC
Start: 2023-08-04 — End: ?

## 2023-08-04 MED ORDER — IPRATROPIUM BROMIDE 0.06 % NA SOLN
2.0000 | Freq: Every day | NASAL | 5 refills | Status: AC
Start: 2023-08-04 — End: ?

## 2023-08-04 MED ORDER — BEPOTASTINE BESILATE 1.5 % OP SOLN: 1.0000 [drp] | Freq: Every day | OPHTHALMIC | 5 refills | Status: AC | PRN

## 2023-08-04 NOTE — Patient Instructions (Addendum)
-   Continue avoidance measures for grasses, ragweed, weeds, trees, outdoor molds, cat, dog, cockroach, and mixed feathers - Continue with:  Allegra  (fexofenadine ) 180mg  tablet once daily as needed. Flonase  2 sprays each nostril daily for 1-2 weeks at a time before stopping once nasal congestion improves for maximum benefit - Start the following: Bepreve 1 drop each eye twice a day as needed for itchy/watery eyes Atrovent  0.06% nasal spray 2 sprays each nostril as needed up to 4 times a day for nasal drainage, throat clearing symptoms - You can use an extra dose of the antihistamine, if needed, for breakthrough symptoms.  - Consider nasal saline rinses 1-2 times daily to remove allergens from the nasal cavities as well as help with mucous clearance (this is especially helpful to do before the nasal sprays are given) - Consider allergy  shots as a means of long-term control. - Allergy  shots re-train and reset the immune system to ignore environmental allergens and decrease the resulting immune response to those allergens (sneezing, itchy watery eyes, runny nose, nasal congestion, etc).    - Allergy  shots improve symptoms in 75-85% of patients.  - We can discuss more at a future appointment if the medications are not working for you.  - Continue use of bug repellants - If bitten do the following: Ice affected area Oral antihistamine (Benadryl  or Alle gra) Oral anti-inflammatory (ibuprofen) Topical corticosteroid (Triamcinolone  ointment) - Fire ant skin testing was positive -Stinging insect panel did show positive/detectable levels to hornet, yellowjacket, wasp - do your best to avoid stinging insects  - Cherry skin testing was positive.   -You successfully passed shrimp challenge in office thus you are not allergic to shrimp and can keep this food item in the diet.   - Recommend you have access to self-injectable epinephrine  (Epipen  or AuviQ) 0.3mg  at all times - Follow emergency action  plan in case of allergic reaction - Continue avoidance of fresh apple, cherry  in diet  Follow-up in 4-6 months or sooner if needed

## 2023-08-04 NOTE — Progress Notes (Signed)
 Follow-up Note  RE: Lori Foster MRN: [id number] DOB: 01-30-1976 Date of Office Visit: 08/04/2023   History of present illness: Lori Foster is a 48 y.o. female presenting today for follow-up of allergic rhinitis with conjunctivitis, insect bite reactivity/angioedema, food allergy  with pollen food allergy  syndrome. She was last seen in the office on 05-03-23 by our nurse practitioner Cheryl where she underwent a shrimp challenge that was successfully passed.  Discussed the use of AI scribe software for clinical note transcription with the patient, who gave verbal consent to proceed.  Since last visit she has since incorporated shrimp into her diet without any adverse reactions.  She states she has had at least 2-3 ingestions of shrimp since the last visit.  She continues to avoid cherry and fresh apple due to known allergies.  She denies any sting since the last visit.  She has an EpiPen  for emergency use which she has not needed to use.  In terms of environmental allergies, the patient reports symptoms of stuffy nose primarily, throat itchiness, and throat clearing. She uses Flonase  pretty consistently and takes Allegra  as needed, which she reports as helpful. She expresses some uncertainty about when to stop using Flonase .  She also would like a different eyedrop hopefully that is covered with insurance.  The Ryaltris  spray was not covered with insurance.    Review of systems: 10pt ROS negative unless noted above in HPI  All other systems negative unless noted above in HPI  Past medical/social/surgical/family history have been reviewed and are unchanged unless specifically indicated below.  No changes  Medication List: Current Outpatient Medications  Medication Sig Dispense Refill   EPINEPHrine  0.3 mg/0.3 mL IJ SOAJ injection Inject 0.3 mg into the muscle as needed for anaphylaxis. 1 each 2   fexofenadine  (ALLEGRA  ALLERGY ) 180 MG tablet Take 1 tablet (180 mg total) by mouth daily.  30 tablet 5   fluticasone  (FLONASE ) 50 MCG/ACT nasal spray Place 2 sprays into both nostrils daily.     cyclobenzaprine  (FLEXERIL ) 10 MG tablet Take 1 tablet (10 mg total) by mouth 3 (three) times daily as needed for muscle spasms. (Patient not taking: Reported on 08/04/2023) 15 tablet 0   HYDROcodone -acetaminophen  (NORCO) 5-325 MG tablet Take 1 tablet by mouth every 6 (six) hours as needed for severe pain. (Patient not taking: Reported on 08/04/2023) 10 tablet 0   ibuprofen (ADVIL,MOTRIN) 200 MG tablet Take 400 mg by mouth every 6 (six) hours as needed for headache (pain). (Patient not taking: Reported on 05/03/2023)     naproxen  (NAPROSYN ) 500 MG tablet Take 1 tablet (500 mg total) by mouth 2 (two) times daily as needed for mild pain, moderate pain or headache (TAKE WITH MEALS.). (Patient not taking: Reported on 08/04/2023) 20 tablet 0   naproxen  sodium (ALEVE ) 220 MG tablet Take 2 tablets (440 mg total) by mouth 2 (two) times daily as needed (pain). (Patient not taking: Reported on 08/04/2023) 20 tablet 0   Olopatadine  HCl 0.2 % SOLN Apply 1 drop to eye daily as needed (itchy/watery eyes). 2.5 mL 5   Olopatadine -Mometasone (RYALTRIS ) 665-25 MCG/ACT SUSP Place 2 sprays into the nose 2 (two) times daily. 29 g 5   triamcinolone  ointment (KENALOG ) 0.5 % Apply 1 Application topically 2 (two) times daily. (Patient not taking: Reported on 08/04/2023) 30 g 5   No current facility-administered medications for this visit.     Known medication allergies: No Active Allergies   Physical examination: Blood pressure (!) 140/92, pulse 77, temperature  98.2 F (36.8 C), temperature source Temporal, resp. rate 16, SpO2 99%.  General: Alert, interactive, in no acute distress. HEENT: PERRLA, TMs pearly gray, turbinates mildly edematous without discharge, post-pharynx non erythematous. Neck: Supple without lymphadenopathy. Lungs: Clear to auscultation without wheezing, rhonchi or rales. {no increased work of  breathing. CV: Normal S1, S2 without murmurs. Abdomen: Nondistended, nontender. Skin: Warm and dry, without lesions or rashes. Extremities:  No clubbing, cyanosis or edema. Neuro:   Grossly intact.  Diagnositics/Labs: Labs:  Component     Latest Ref Rng 03/23/2023  Bumblebee     Class 0 kU/L <0.10   Hornet, White Face, IgE     Class 0/I kU/L 0.15 !   Hornet, Yellow, IgE     Class 0/I kU/L 0.17 !   I001-IgE Honeybee     Class 0 kU/L <0.10   I208-IgE Api m 1     Class 0 kU/L <0.10   I214-IgE Api m 2     Class 0 kU/L <0.10   I215-IgE Api m 3     Class 0 kU/L <0.10   I216-IgE Api m 5     Class 0 kU/L <0.10   I217-IgE Api m 10     Class 0 kU/L <0.10   Yellow Jacket, IgE     Class II kU/L 0.56 !   I211-IgE Ves v 1     Class 0 kU/L <0.10   I209-IgE Ves v 5     Class II kU/L 1.15 !   Paper Wasp IgE     Class III kU/L 1.47 !   I210-IgE Pol d 5     Class IV kU/L 7.91 !   Tryptase     2.2 - 13.2 ug/L 3.2   Shrimp IgE     Class 0/I kU/L 0.11 !   IgE (Immunoglobulin E), Serum     6 - 495 IU/mL 850 (H)   Allergen Comments Note     Assessment and plan: Allergic rhinitis with conjunctivitis - Continue avoidance measures for grasses, ragweed, weeds, trees, outdoor molds, cat, dog, cockroach, and mixed feathers - Continue with:  Allegra  (fexofenadine ) 180mg  tablet once daily as needed. Flonase  2 sprays each nostril daily for 1-2 weeks at a time before stopping once nasal congestion improves for maximum benefit - Start the following: Bepreve 1 drop each eye twice a day as needed for itchy/watery eyes Atrovent  0.06% nasal spray 2 sprays each nostril as needed up to 4 times a day for nasal drainage, throat clearing symptoms - You can use an extra dose of the antihistamine, if needed, for breakthrough symptoms.  - Consider nasal saline rinses 1-2 times daily to remove allergens from the nasal cavities as well as help with mucous clearance (this is especially helpful to do before the  nasal sprays are given) - Consider allergy  shots as a means of long-term control. - Allergy  shots re-train and reset the immune system to ignore environmental allergens and decrease the resulting immune response to those allergens (sneezing, itchy watery eyes, runny nose, nasal congestion, etc).    - Allergy  shots improve symptoms in 75-85% of patients.  - We can discuss more at a future appointment if the medications are not working for you.  Hymenoptera allergy  - Continue use of bug repellants - If bitten do the following: Ice affected area Oral antihistamine (Benadryl  or Alle gra) Oral anti-inflammatory (ibuprofen) Topical corticosteroid (Triamcinolone  ointment) - Fire ant skin testing was positive -Stinging insect panel did show positive/detectable levels  to hornet, yellowjacket, wasp - do your best to avoid stinging insects  Food allergy  Pollen food allergy  syndrome - Cherry skin testing was positive.   -You successfully passed shrimp challenge in office thus you are not allergic to shrimp and can keep this food item in the diet.   - Recommend you have access to self-injectable epinephrine  (Epipen  or AuviQ) 0.3mg  at all times - Follow emergency action plan in case of allergic reaction - Continue avoidance of fresh apple, cherry  in diet  Follow-up in 4-6 months or sooner if needed  I appreciate the opportunity to take part in [place] care. Please do not hesitate to contact me with questions.  Sincerely,   Danita Brain, MD Allergy /Immunology [facility]

## 2023-09-02 DIAGNOSIS — Z419 Encounter for procedure for purposes other than remedying health state, unspecified: Secondary | ICD-10-CM | POA: Diagnosis not present

## 2023-09-30 DIAGNOSIS — Z419 Encounter for procedure for purposes other than remedying health state, unspecified: Secondary | ICD-10-CM | POA: Diagnosis not present

## 2023-11-11 DIAGNOSIS — Z419 Encounter for procedure for purposes other than remedying health state, unspecified: Secondary | ICD-10-CM | POA: Diagnosis not present

## 2023-12-11 DIAGNOSIS — Z419 Encounter for procedure for purposes other than remedying health state, unspecified: Secondary | ICD-10-CM | POA: Diagnosis not present

## 2024-01-11 DIAGNOSIS — Z419 Encounter for procedure for purposes other than remedying health state, unspecified: Secondary | ICD-10-CM | POA: Diagnosis not present

## 2024-02-10 DIAGNOSIS — Z419 Encounter for procedure for purposes other than remedying health state, unspecified: Secondary | ICD-10-CM | POA: Diagnosis not present

## 2024-03-12 DIAGNOSIS — Z419 Encounter for procedure for purposes other than remedying health state, unspecified: Secondary | ICD-10-CM | POA: Diagnosis not present

## 2024-04-12 DIAGNOSIS — Z419 Encounter for procedure for purposes other than remedying health state, unspecified: Secondary | ICD-10-CM | POA: Diagnosis not present
